# Patient Record
Sex: Female | Born: 1950 | Race: White | Hispanic: No | Marital: Single | Smoking: Never smoker
Health system: Southern US, Community
[De-identification: ages and names within clinical notes are randomized; demographics above are authoritative.]

## PROBLEM LIST (undated history)

## (undated) DIAGNOSIS — F329 Major depressive disorder, single episode, unspecified: Secondary | ICD-10-CM

## (undated) DIAGNOSIS — F32A Depression, unspecified: Secondary | ICD-10-CM

## (undated) DIAGNOSIS — E785 Hyperlipidemia, unspecified: Secondary | ICD-10-CM

## (undated) DIAGNOSIS — M25569 Pain in unspecified knee: Secondary | ICD-10-CM

## (undated) DIAGNOSIS — G8929 Other chronic pain: Secondary | ICD-10-CM

## (undated) DIAGNOSIS — I10 Essential (primary) hypertension: Secondary | ICD-10-CM

## (undated) HISTORY — PX: OTHER SURGICAL HISTORY: SHX169

## (undated) HISTORY — PX: ABDOMINAL HYSTERECTOMY: SHX81

## (undated) HISTORY — PX: KNEE SURGERY: SHX244

---

## 2011-09-12 ENCOUNTER — Emergency Department (HOSPITAL_BASED_OUTPATIENT_CLINIC_OR_DEPARTMENT_OTHER)
Admission: EM | Admit: 2011-09-12 | Discharge: 2011-09-12 | Disposition: A | Discharge status: Home Health | Payer: Worker's Compensation | Attending: Emergency Medicine | Admitting: Emergency Medicine

## 2011-09-12 ENCOUNTER — Encounter (HOSPITAL_BASED_OUTPATIENT_CLINIC_OR_DEPARTMENT_OTHER): Payer: Self-pay | Admitting: *Deleted

## 2011-09-12 ENCOUNTER — Emergency Department (INDEPENDENT_AMBULATORY_CARE_PROVIDER_SITE_OTHER): Payer: Worker's Compensation

## 2011-09-12 DIAGNOSIS — S42309A Unspecified fracture of shaft of humerus, unspecified arm, initial encounter for closed fracture: Secondary | ICD-10-CM

## 2011-09-12 DIAGNOSIS — S42253A Displaced fracture of greater tuberosity of unspecified humerus, initial encounter for closed fracture: Secondary | ICD-10-CM | POA: Insufficient documentation

## 2011-09-12 DIAGNOSIS — E7800 Pure hypercholesterolemia, unspecified: Secondary | ICD-10-CM | POA: Insufficient documentation

## 2011-09-12 DIAGNOSIS — W19XXXA Unspecified fall, initial encounter: Secondary | ICD-10-CM

## 2011-09-12 DIAGNOSIS — Z79899 Other long term (current) drug therapy: Secondary | ICD-10-CM | POA: Insufficient documentation

## 2011-09-12 DIAGNOSIS — W010XXA Fall on same level from slipping, tripping and stumbling without subsequent striking against object, initial encounter: Secondary | ICD-10-CM | POA: Insufficient documentation

## 2011-09-12 DIAGNOSIS — I10 Essential (primary) hypertension: Secondary | ICD-10-CM | POA: Insufficient documentation

## 2011-09-12 HISTORY — DX: Essential (primary) hypertension: I10

## 2011-09-12 HISTORY — DX: Major depressive disorder, single episode, unspecified: F32.9

## 2011-09-12 HISTORY — DX: Pain in unspecified knee: M25.569

## 2011-09-12 HISTORY — DX: Hyperlipidemia, unspecified: E78.5

## 2011-09-12 HISTORY — DX: Depression, unspecified: F32.A

## 2011-09-12 HISTORY — DX: Other chronic pain: G89.29

## 2011-09-12 MED ORDER — OXYCODONE-ACETAMINOPHEN 5-325 MG PO TABS: 2.0000 | ORAL_TABLET | ORAL | Status: AC | PRN

## 2011-09-12 MED ORDER — ONDANSETRON 4 MG PO TBDP
ORAL_TABLET | ORAL | Status: AC
  Administered 2011-09-12: 4 mg
  Filled 2011-09-12: qty 1

## 2011-09-12 MED ORDER — HYDROMORPHONE HCL PF 1 MG/ML IJ SOLN
1.0000 mg | Freq: Once | INTRAMUSCULAR | Status: AC
  Administered 2011-09-12: 1 mg via INTRAVENOUS
  Filled 2011-09-12: qty 1

## 2011-09-12 NOTE — ED Provider Notes (Signed)
Medical screening examination/treatment/procedure(s) were performed by non-physician practitioner and as supervising physician I was immediately available for consultation/collaboration.   Fernand Sorbello A. Dondrell Loudermilk, MD 09/12/11 2348 

## 2011-09-12 NOTE — Discharge Instructions (Signed)
Upper Extremity Fracture Broken bones take weeks to heal and require protection and proper follow-up. The broken ends must be lined up correctly and kept perfectly still for proper healing. Do not remove the splint, immobilizer, or cast that has been applied to treat your injury until instructed to do so by your caregiver. This is the most important part of your treatment. Other measures to treat fractures include:  Keeping the injured limb at rest and elevated as recommended by your caregiver. This will help reduce pain and swelling. Use pillows to rest and elevate your arm on at night.   Applying ice packs to your fracture site frequently for the next 2 to 3 days.   Pain medicine is often prescribed in the first days after a fracture. Only take over-the-counter or prescription medicines for pain, discomfort, or fever as directed by your caregiver.  Proper follow-up care is very important, so call your caregiver for an appointment as soon as possible. Follow-up X-rays are generally recommended to monitor healing. SEEK IMMEDIATE MEDICAL CARE IF:  You notice increasing pain or pressure in the injured arm or hand, or if it your extremity becomes cold, numb, or pale.  MAKE SURE YOU:   Understand these instructions.   Will watch your condition.   Will get help right away if you are not doing well or get worse.  Document Released: 08/18/2004 Document Revised: 03/23/2011 Document Reviewed: 08/13/2008 ExitCare Patient Information 2012 [employer]. 

## 2011-09-12 NOTE — ED Notes (Signed)
Vrinda Pickering, FNP at bedside 

## 2011-09-12 NOTE — ED Notes (Signed)
EMS reports patient was at work and slipped on a clothes hanger and fell.  Caught herself with her right arm.  Now has pain right upper arm.

## 2011-09-12 NOTE — ED Notes (Signed)
Pt returned from radiology.

## 2011-09-12 NOTE — ED Notes (Signed)
#  20 g angiocath inserted in left ac by [facility].  Fentanyl given for pain.  VSS, NSR.

## 2011-09-12 NOTE — ED Provider Notes (Signed)
History     CSN: [id number]  Arrival date & time 09/12/11  9562   First MD Initiated Contact with Patient 09/12/11 1832      Chief Complaint  Patient presents with  . Fall    right upper arm pain    (Consider location/radiation/quality/duration/timing/severity/associated sxs/prior treatment) HPI Comments: Pt was at worked and slipped on a hanger and fell:pt states that she is having pain in the right upper arm and is unable to fully lift her arm  Patient is a 61 y.o. female presenting with fall. The history is provided by the patient. No language interpreter was used.  Fall The accident occurred 1 to 2 hours ago. The fall occurred while standing. She landed on a hard floor. Point of impact: right arm. Pain location: right arm. The pain is moderate. She was ambulatory at the scene. There was no entrapment after the fall. There was no drug use involved in the accident. There was no alcohol use involved in the accident. The symptoms are aggravated by activity.    Past Medical History  Diagnosis Date  . Hypertension   . Chronic knee pain     keft  . Hyperlipemia   . Depression     Past Surgical History  Procedure Date  . Abdominal hysterectomy   . Knee surgery     No family history on file.  History  Substance Use Topics  . Smoking status: Never Smoker   . Smokeless tobacco: Never Used  . Alcohol Use: No    OB History    Grav Para Term Preterm Abortions TAB SAB Ect Mult Living                  Review of Systems  All other systems reviewed and are negative.    Allergies  Review of patient's allergies indicates no known allergies.  Home Medications   Current Outpatient Rx  Name Route Sig Dispense Refill  . ATENOLOL 25 MG PO TABS Oral Take 25 mg by mouth daily.    . BUPROPION HCL 100 MG PO TABS Oral Take 100 mg by mouth 2 (two) times daily.     Marland Kitchen CALCIUM PO Oral Take 1 tablet by mouth daily.    Marland Kitchen ESTRADIOL 0.5 MG PO TABS Oral Take by mouth daily.    .  IBUPROFEN 200 MG PO TABS Oral Take 800 mg by mouth 2 (two) times daily.    . OMEGA-3-ACID ETHYL ESTERS 1 G PO CAPS Oral Take 2 g by mouth 2 (two) times daily.    Marland Kitchen SIMVASTATIN 20 MG PO TABS Oral Take 20 mg by mouth every evening.      Ht 5\' 6"  (1.676 m)  Wt 205 lb (92.987 kg)  BMI 33.09 kg/m2  Physical Exam  Nursing note and vitals reviewed. Constitutional: She appears well-developed and well-nourished.  HENT:  Head: Normocephalic.  Eyes: EOM are normal.  Neck: Neck supple.  Cardiovascular: Normal rate and regular rhythm.   Pulmonary/Chest: Effort normal and breath sounds normal.  Abdominal: Soft. Bowel sounds are normal.  Musculoskeletal:       Cervical back: Normal.       Thoracic back: Normal.       Lumbar back: Normal.       Right upper arm: She exhibits tenderness. She exhibits no deformity.  Skin: Skin is warm and dry.  Psychiatric: She has a normal mood and affect.    ED Course  Procedures (including critical care time)  Labs Reviewed -  No data to display Dg Shoulder Right  09/12/2011  *RADIOLOGY REPORT*  Clinical Data: Larey Seat with right shoulder pain  RIGHT SHOULDER - 2+ VIEW  Comparison: None.  Findings: There is fracture of the right humeral greater tuberosity which also appears to involve the right humeral neck.  This fracture is nondisplaced.  The humeral head is in normal position within the glenoid fossa.  The right [facility] joint is normally aligned.  IMPRESSION: Fracture of the right greater tuberosity at the right humeral neck without significant displacement.  Original Report Authenticated By: Juline Patch, M.D.   Dg Humerus Right  09/12/2011  *RADIOLOGY REPORT*  Clinical Data: Larey Seat at work injuring the right upper arm  RIGHT HUMERUS - 2+ VIEW  Comparison: None.  Findings: There is fracture involving the right humeral greater tuberosity and possibly a nondisplaced fracture of the humeral neck.  CT may be helpful to assess more sensitively.  The remainder of the humerus  is intact.  IMPRESSION: Fracture of the right humeral greater tuberosity and possibly the right humeral neck.  Consider CT to assess further.  Original Report Authenticated By: Juline Patch, M.D.     1. Humerus fracture       MDM  Pt placed in a sling and to follow up with ortho:pt treated with pain medication:       Teressa Lower, NP 09/12/11 1944

## 2012-04-27 DIAGNOSIS — C50919 Malignant neoplasm of unspecified site of unspecified female breast: Secondary | ICD-10-CM | POA: Insufficient documentation

## 2013-03-04 DIAGNOSIS — I890 Lymphedema, not elsewhere classified: Secondary | ICD-10-CM | POA: Insufficient documentation

## 2013-03-06 DIAGNOSIS — E559 Vitamin D deficiency, unspecified: Secondary | ICD-10-CM | POA: Insufficient documentation

## 2013-03-13 DIAGNOSIS — M810 Age-related osteoporosis without current pathological fracture: Secondary | ICD-10-CM | POA: Insufficient documentation

## 2013-07-08 DIAGNOSIS — I10 Essential (primary) hypertension: Secondary | ICD-10-CM | POA: Insufficient documentation

## 2013-07-08 DIAGNOSIS — H409 Unspecified glaucoma: Secondary | ICD-10-CM | POA: Insufficient documentation

## 2014-03-24 DIAGNOSIS — H25819 Combined forms of age-related cataract, unspecified eye: Secondary | ICD-10-CM | POA: Insufficient documentation

## 2014-04-29 DIAGNOSIS — Z9849 Cataract extraction status, unspecified eye: Secondary | ICD-10-CM | POA: Insufficient documentation

## 2014-07-29 DIAGNOSIS — A419 Sepsis, unspecified organism: Secondary | ICD-10-CM | POA: Insufficient documentation

## 2014-07-29 DIAGNOSIS — L02223 Furuncle of chest wall: Secondary | ICD-10-CM | POA: Insufficient documentation

## 2014-07-29 DIAGNOSIS — E782 Mixed hyperlipidemia: Secondary | ICD-10-CM | POA: Insufficient documentation

## 2014-07-29 DIAGNOSIS — E270 Other adrenocortical overactivity: Secondary | ICD-10-CM | POA: Insufficient documentation

## 2015-05-08 DIAGNOSIS — I890 Lymphedema, not elsewhere classified: Secondary | ICD-10-CM | POA: Diagnosis not present

## 2015-05-08 DIAGNOSIS — G629 Polyneuropathy, unspecified: Secondary | ICD-10-CM | POA: Diagnosis not present

## 2015-05-08 DIAGNOSIS — C50112 Malignant neoplasm of central portion of left female breast: Secondary | ICD-10-CM | POA: Diagnosis not present

## 2015-05-08 DIAGNOSIS — C50512 Malignant neoplasm of lower-outer quadrant of left female breast: Secondary | ICD-10-CM | POA: Diagnosis not present

## 2015-05-08 DIAGNOSIS — T451X5A Adverse effect of antineoplastic and immunosuppressive drugs, initial encounter: Secondary | ICD-10-CM | POA: Diagnosis not present

## 2015-05-08 DIAGNOSIS — F4321 Adjustment disorder with depressed mood: Secondary | ICD-10-CM | POA: Diagnosis not present

## 2015-05-08 DIAGNOSIS — G6289 Other specified polyneuropathies: Secondary | ICD-10-CM

## 2015-05-08 DIAGNOSIS — G622 Polyneuropathy due to other toxic agents: Secondary | ICD-10-CM | POA: Insufficient documentation

## 2015-05-08 DIAGNOSIS — G620 Drug-induced polyneuropathy: Secondary | ICD-10-CM | POA: Diagnosis not present

## 2015-05-08 DIAGNOSIS — Z171 Estrogen receptor negative status [ER-]: Secondary | ICD-10-CM | POA: Diagnosis not present

## 2015-06-16 DIAGNOSIS — K222 Esophageal obstruction: Secondary | ICD-10-CM | POA: Diagnosis not present

## 2015-06-16 DIAGNOSIS — K219 Gastro-esophageal reflux disease without esophagitis: Secondary | ICD-10-CM | POA: Diagnosis not present

## 2015-07-22 DIAGNOSIS — R3915 Urgency of urination: Secondary | ICD-10-CM | POA: Diagnosis not present

## 2015-07-22 DIAGNOSIS — R232 Flushing: Secondary | ICD-10-CM | POA: Diagnosis not present

## 2015-07-22 DIAGNOSIS — G4709 Other insomnia: Secondary | ICD-10-CM | POA: Diagnosis not present

## 2015-07-22 DIAGNOSIS — F419 Anxiety disorder, unspecified: Secondary | ICD-10-CM | POA: Diagnosis not present

## 2015-07-22 DIAGNOSIS — R002 Palpitations: Secondary | ICD-10-CM | POA: Diagnosis not present

## 2015-07-22 DIAGNOSIS — F329 Major depressive disorder, single episode, unspecified: Secondary | ICD-10-CM | POA: Diagnosis not present

## 2015-07-22 DIAGNOSIS — I10 Essential (primary) hypertension: Secondary | ICD-10-CM | POA: Diagnosis not present

## 2015-08-24 ENCOUNTER — Ambulatory Visit (INDEPENDENT_AMBULATORY_CARE_PROVIDER_SITE_OTHER): Payer: 59 | Admitting: Licensed Clinical Social Worker

## 2015-08-24 DIAGNOSIS — Z634 Disappearance and death of family member: Principal | ICD-10-CM

## 2015-08-24 DIAGNOSIS — F4329 Adjustment disorder with other symptoms: Secondary | ICD-10-CM

## 2015-08-24 DIAGNOSIS — F4321 Adjustment disorder with depressed mood: Secondary | ICD-10-CM

## 2015-08-25 ENCOUNTER — Encounter (HOSPITAL_COMMUNITY): Payer: Self-pay | Admitting: Licensed Clinical Social Worker

## 2015-08-25 DIAGNOSIS — F4321 Adjustment disorder with depressed mood: Secondary | ICD-10-CM | POA: Insufficient documentation

## 2015-08-25 DIAGNOSIS — Z634 Disappearance and death of family member: Principal | ICD-10-CM

## 2015-08-25 DIAGNOSIS — F4329 Adjustment disorder with other symptoms: Secondary | ICD-10-CM | POA: Insufficient documentation

## 2015-08-25 NOTE — Progress Notes (Signed)
Comprehensive Clinical Assessment (CCA) Note  08/25/2015 Gabriela Jensen [id number]  Visit Diagnosis:      ICD-9-CM ICD-10-CM   1. 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  (See scanned document in Chart Review)  CCA Part Two A  Intake/Chief Complaint:  CCA Intake With Chief Complaint CCA Part Two Date: 08/24/15 CCA Part Two Time: 1004 Chief Complaint/Presenting Problem: "I can't get past the death of my friend."  Had a close relationship with Rush Landmark for about 12 years until his death in 05/17/13.   They spoke or saw each other daily.  Notes that she lost her job shortly before his death.  Did not get unemployment.   Patients Currently Reported Symptoms/Problems: Excessive crying spells.  Being overly emotional prevents her from getting tasks done.  Reminded of her friend whereever she goes so she avoids going out.  Has had some significant health problems in the past two years.  Reports "Breast cancer was a piece of cake."  Had implants placed.  Having trouble adjusting to changes in the look and feel of her body.  In January 2016 she contracted MRSA.  Needed home health care for 4 months.  Couldn't do much.  Gained a lot of weight.   Individual's Strengths: "I think I'm nice.  People tell me that.  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   Individual's Preferences: "I need tools to learn how to live without Bill."   Type of Services Patient Feels Are Needed: Therapy and med management  Has appointment to see our psychiatrist within the week.  Reports she has been on antidepressants since 2006.  Mental Health Symptoms Depression:  Depression: Tearfulness, Weight gain/loss  Mania:     Anxiety:   Anxiety: Worrying, Irritability  Psychosis:  Psychosis: N/A  Trauma:  Trauma: N/A  Obsessions:  Obsessions: N/A  Compulsions:  Compulsions: N/A  Inattention:  Inattention: N/A   Hyperactivity/Impulsivity:  Hyperactivity/Impulsivity: N/A  Oppositional/Defiant Behaviors:  Oppositional/Defiant Behaviors: N/A  Borderline Personality:     Other Mood/Personality Symptoms:      Mental Status Exam Appearance and self-care  Stature:  Stature: Average  Weight:  Weight: Obese  Clothing:  Clothing: Casual  Grooming:  Grooming: Normal  Cosmetic use:  Cosmetic Use: None  Posture/gait:  Posture/Gait: Normal  Motor activity:  Motor Activity: Not Remarkable  Sensorium  Attention:  Attention: Distractible  Concentration:  Concentration: Scattered  Orientation:  Orientation: X5  Recall/memory:     Affect and Mood  Affect:  Affect: Labile, Tearful  Mood:  Mood: Anxious  Relating  Eye contact:  Eye Contact: Normal  Facial expression:  Facial Expression: Sad, Anxious  Attitude toward examiner:  Attitude Toward Examiner: Dramatic  Thought and Language  Speech flow: Speech Flow: Pressured  Thought content:  Thought Content: Appropriate to mood and circumstances  Preoccupation:     Hallucinations:     Organization:     Transport planner of Knowledge:     Intelligence:     Abstraction:     Judgement:     Reality Testing:     Insight:     Decision Making:     Social Functioning  Social Maturity:  Social Maturity: Isolates (Reports she is starting to connect with friends again.)  Social Judgement:     Stress  Stressors:  Stressors: Grief/losses  Coping Ability:  Coping Ability:  Overwhelmed, Deficient supports  Skill Deficits:     Supports:      Family and Psychosocial History: Family history Marital status: Divorced Divorced, when?: Married to Grimes for 21 years.  Divorced in 1991.  He lives in [place].  "He didn't like that I was fat.  He had an affair."  He is moving into the area.   Does patient have children?: Yes How many children?: 2 How is patient's relationship with their children?: Daughter, Andee Poles (42)  Lives in [place].  Daughter,  Sharyn Lull lives in [place].  Reports both daughters have expressed concern about her mental health saying she is "hard to deal with."  Childhood History:  Childhood History Additional childhood history information: Born and raised in [place].   Does patient have siblings?: Yes Number of Siblings: 1 Description of patient's current relationship with siblings: Brother lives in [place].   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  Not able to ride anymore due to problems with balance and neuropathy.  Enjoys watching her grandchildren play sports.  Sometimes stays in bed watching TV.  Exercise/Diet: Exercise/Diet Do You Exercise?: No (Reports she plans to start because she joined the [employer] program.) Do You Follow a Special Diet?: No Do You Have Any Trouble Sleeping?: No  CCA Part Two C  Alcohol/Drug Use: Alcohol / Drug Use History of alcohol / drug use?: No history of alcohol / drug abuse                      CCA Part Three  ASAM's:  Six Dimensions of Multidimensional Assessment  Dimension 1:  Acute Intoxication and/or Withdrawal Potential:     Dimension 2:   Biomedical Conditions and Complications:     Dimension 3:  Emotional, Behavioral, or Cognitive Conditions and Complications:     Dimension 4:  Readiness to Change:     Dimension 5:  Relapse, Continued use, or Continued Problem Potential:     Dimension 6:  Recovery/Living Environment:      Substance use Disorder (SUD)    Social Function:  Social Functioning Social Maturity: Isolates (Reports she is starting to connect with friends again.)  Stress:  Stress Stressors: Grief/losses Coping Ability: Overwhelmed, Deficient supports Patient Takes Medications The Way The Doctor Instructed?: Yes  Risk Assessment- Self-Harm Potential: Risk Assessment For Self-Harm Potential Thoughts of Self-Harm: No current thoughts Additional Comments for Self-Harm Potential: Denies any history of SI or self harm  Risk Assessment -Dangerous to Others Potential: Risk Assessment For Dangerous to Others Potential Method: No Plan Additional Comments for Danger to Others Potential: Denies any history of harm to others  DSM5 Diagnoses: Patient Active Problem List   Diagnosis Date Noted  .  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 Systemic infection ([place]) 07/29/2014  . Combined fat and carbohydrate induced hyperlipemia 07/29/2014  . Furuncle of chest wall 07/29/2014  . H/O cataract extraction 04/29/2014  . 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 Essential (primary) hypertension 07/08/2013  . Glaucoma 07/08/2013  . OP (osteoporosis) 03/13/2013  . Avitaminosis D 03/06/2013  . Lymphedema of arm 03/04/2013  . Malignant neoplasm of breast ([place]) 04/27/2012      Recommendations for Services/Supports/Treatments: Recommendations for Services/Supports/Treatments Recommendations For Services/Supports/Treatments: Individual Therapy, Medication Management   Garnette Scheuermann

## 2015-08-27 ENCOUNTER — Ambulatory Visit (INDEPENDENT_AMBULATORY_CARE_PROVIDER_SITE_OTHER): Payer: 59 | Admitting: Psychiatry

## 2015-08-27 ENCOUNTER — Encounter (HOSPITAL_COMMUNITY): Payer: Self-pay | Admitting: Psychiatry

## 2015-08-27 VITALS — BP 130/72 | HR 62 | Ht 65.5 in | Wt 235.0 lb

## 2015-08-27 DIAGNOSIS — F4321 Adjustment disorder with depressed mood: Secondary | ICD-10-CM

## 2015-08-27 DIAGNOSIS — F331 Major depressive disorder, recurrent, moderate: Secondary | ICD-10-CM

## 2015-08-27 DIAGNOSIS — Z634 Disappearance and death of family member: Principal | ICD-10-CM

## 2015-08-27 DIAGNOSIS — G4700 Insomnia, unspecified: Secondary | ICD-10-CM | POA: Diagnosis not present

## 2015-08-27 DIAGNOSIS — F4329 Adjustment disorder with other symptoms: Secondary | ICD-10-CM

## 2015-08-27 MED ORDER — ESCITALOPRAM OXALATE 10 MG PO TABS: 10.0000 mg | ORAL_TABLET | Freq: Every day | ORAL | Status: DC

## 2015-08-27 MED ORDER — BUPROPION HCL 100 MG PO TABS: 100.0000 mg | ORAL_TABLET | Freq: Two times a day (BID) | ORAL | Status: DC

## 2015-08-27 NOTE — Progress Notes (Signed)
Psychiatric Initial Adult Assessment   Patient Identification: Gabriela Jensen MRN:  [id number] Date of Evaluation:  08/27/2015 Referral Source: Primary care Chief Complaint:   Chief Complaint    Establish Care     Visit Diagnosis:    ICD-9-CM ICD-10-CM   1. 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 Insomnia 780.52 G47.00   3. Moderate episode of recurrent major depressive disorder (HCC) 296.32 F33.1    Diagnosis:   Patient Active Problem List   Diagnosis Date Noted  . Complicated bereavement 0000000 08/25/2015  . 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 Systemic infection ([place]) [A41.9] 07/29/2014  . 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 Furuncle of chest wall [L02.223] 07/29/2014  . H/O cataract extraction [Z98.49] 04/29/2014  . 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 Essential (primary) hypertension [I10] 07/08/2013  . Glaucoma [H40.9] 07/08/2013  . OP (osteoporosis) [M81.0] 03/13/2013  . Avitaminosis D [E55.9] 03/06/2013  . Lymphedema of arm [I89.0] 03/04/2013  . Malignant neoplasm of breast [facility]) [C50.919] 04/27/2012   History of Present Illness:  65 years old currently single Caucasian female referred by primary care physician for management of grief and depression. She is also seeing her counselor.  Patient is lost a close friend named Bell in 11/03/12 since then she has been suffering from grief. Gets emotional when she talks about him. She always thinks about him. She is getting depressed a motivated. She is going through the grief but it is not calm much better. She is having crying spells. Her sleep is improved since she is on gabapentin which she takes for neuropathy. She also endorses excessive worries, unreasonable at times. 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 Breast  cancer in 11/04/11. Neuropathy related to chemotherapy Modifying factors; her friends she tries to go to the gym Past aggravating factors; divorced in Nov 04, 1990 after that she has been in medications for depression. Panic attack in 2001/11/03  Location;  Friend's death, depression, anxiety, insomnia Severity of depression: 4/10. 10 being no depression Context: friends death, anniversaries and birthdays Duration: variable since Nov 04, 1990  Associated Signs/Symptoms: Depression Symptoms:  depressed mood, anhedonia, insomnia, difficulty concentrating, anxiety, (Hypo) Manic Symptoms:  Distractibility, Anxiety Symptoms:  Excessive Worry, Psychotic Symptoms:  denies PTSD Symptoms: Had a traumatic exposure:  friends death grief Past Psychiatric History: Outpatient treatment for depression in 11-04-90 after divorced. Her husband left her because she was having fluctuations in her. He left her for another friend of hers. Panic attack in 11/03/2001 Past medication use has been Cymbalta that caused sweating.  Lexapro did not help her depression and it did help her anxiety somewhat Past Medical History:  Past Medical History  Diagnosis Date  . Hypertension   . Chronic knee pain     keft  . Hyperlipemia   . Depression     Past Surgical History  Procedure Laterality Date  . Abdominal hysterectomy    . Knee surgery    . Masectomy and reconstruction  11-03-12 and October 2014   Family History:  Family History  Problem Relation Age of Onset  . Alcohol abuse Father    Social History:   Social History   Social History  . Marital Status: Single    Spouse Name: N/A  . Number of Children: N/A  . Years of Education: N/A   Social History Main Topics  .  Smoking status: Never Smoker   . Smokeless tobacco: Never Used  . Alcohol Use: No  . Drug Use: No  . Sexual Activity: Not Currently   Other Topics Concern  . None   Social History Narrative   Additional Social History: She grew up with her parents.  Her dad was an alcoholic he would be harsh towards her brother and her mom. 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 She has grown kids she keeps in touch with her daughter who is bipolar. Currently she is retired herself. She has lost a good friend in 2014 since then she is going through grief process  Musculoskeletal: Strength & Muscle Tone: within normal limits Gait & Station: normal Patient leans: N/A  Psychiatric Specialty Exam: HPI  Review of Systems  Constitutional: Negative for fever.  Musculoskeletal: Negative for neck pain.  Skin: Negative for rash.  Neurological: Negative for tremors.  Psychiatric/Behavioral: Positive for depression. The patient is nervous/anxious and has insomnia.     Blood pressure 130/72, pulse 62, height 5' 5.5" (1.664 m), weight 235 lb (106.595 kg), SpO2 96 %.Body mass index is 38.5 kg/(m^2).  General Appearance: Casual  Eye Contact:  Fair  Speech:  Normal Rate  Volume:  Normal  Mood:  Dysphoric  Affect:  Tearful  Thought Process:  Coherent  Orientation:  Full (Time, Place, and Person)  Thought Content:  Rumination  Suicidal Thoughts:  No  Homicidal Thoughts:  No  Memory:  Immediate;   Fair Recent;   Fair  Judgement:  Fair  Insight:  Fair  Psychomotor Activity:  Normal  Concentration:  Fair  Recall:  [employer] of Knowledge:Fair  Language: Fair  Akathisia:  Negative  Handed:  Right  AIMS (if indicated):    Assets:  Desire for Improvement  ADL's:  Intact  Cognition: WNL  Sleep:  variable   Is the patient at risk to self?  No. Has the patient been a risk to self in the past 6 months?  No. Has the patient been a risk to self within the distant past?  No. Is the patient a risk to others?  No. Has the patient been a risk to others in the past 6 months?  No. Has the patient been a risk to others within the distant  past?  No.  Allergies:  No Known Allergies Current Medications: Current Outpatient Prescriptions  Medication Sig Dispense Refill  . atenolol (TENORMIN) 25 MG tablet Take 25 mg by mouth daily.    Marland Kitchen buPROPion (WELLBUTRIN) 100 MG tablet Take 1 tablet (100 mg total) by mouth 2 (two) times daily. Reported on 08/24/2015 60 tablet 0  . CALCIUM PO Take 1 tablet by mouth daily.    Marland Kitchen estradiol (ESTRACE) 0.5 MG tablet Take by mouth daily.    Marland Kitchen gabapentin (NEURONTIN) 100 MG capsule Take 500 mg by mouth daily.   0  . ibuprofen (ADVIL,MOTRIN) 200 MG tablet Take 800 mg by mouth 2 (two) times daily.    Marland Kitchen omega-3 acid ethyl esters (LOVAZA) 1 G capsule Take 2 g by mouth 2 (two) times daily.    . simvastatin (ZOCOR) 20 MG tablet Take 20 mg by mouth every evening.    . escitalopram (LEXAPRO) 10 MG tablet Take 1 tablet (10 mg total) by mouth daily. Start half tablet for first 5 days and then start one a  day 30 tablet 0   No current facility-administered medications for this visit.    Previous Psychotropic Medications: Yes  Cymbalta; sweating   Substance Abuse History in the last 12 months:  No.  Consequences of Substance Abuse: NA  Medical Decision Making:  Review of Psycho-Social Stressors (1), Discuss test with performing physician (1), Review of Medication Regimen & Side Effects (2) and Review of New Medication or Change in Dosage (2)  Treatment Plan Summary: Medication management and Plan as follows  Grief/complicated: Add Lexapro 5 mg increased to 10 mg next 7-10 days. This will be in addition to the Wellbutrin she is taking. At higher doses of Wellbutrin she does not respond Insomnia: taking neurontin for neuropathy but that is helping her sleep reviewed sleep hygiene She is a nonsmoker. Patient and non-user of alcohol or drugs Major depressive disorder. Her grief very complicated that she is suffering from a recurrent major depression. Continue Wellbutrin and Lexapro as above More than 50% time  spent in counseling and coordination of care and patient education side effects Recommend she continue therapy for her complicated grief Follow-up in 2-4 weeks or earlier if there is call 911 or report to the emergency room for any urgent concerns of suicidal thoughts    Yarelie Hams 2/2/201711:10 AM

## 2015-09-01 DIAGNOSIS — L300 Nummular dermatitis: Secondary | ICD-10-CM | POA: Diagnosis not present

## 2015-09-01 DIAGNOSIS — L820 Inflamed seborrheic keratosis: Secondary | ICD-10-CM | POA: Diagnosis not present

## 2015-09-10 ENCOUNTER — Ambulatory Visit (INDEPENDENT_AMBULATORY_CARE_PROVIDER_SITE_OTHER): Payer: 59 | Admitting: Licensed Clinical Social Worker

## 2015-09-10 DIAGNOSIS — F331 Major depressive disorder, recurrent, moderate: Secondary | ICD-10-CM

## 2015-09-10 DIAGNOSIS — F4321 Adjustment disorder with depressed mood: Secondary | ICD-10-CM | POA: Diagnosis not present

## 2015-09-10 DIAGNOSIS — F4329 Adjustment disorder with other symptoms: Secondary | ICD-10-CM

## 2015-09-10 DIAGNOSIS — Z634 Disappearance and death of family member: Principal | ICD-10-CM

## 2015-09-10 NOTE — Progress Notes (Signed)
   THERAPIST PROGRESS NOTE  Session Time: 10:05-11:00am  Participation Level: Active  Behavioral Response: CasualAlert A little tearful  Type of Therapy: Individual Therapy  Treatment Goals addressed: Developed treatment plan today  Interventions: Treatment planning and mindfulness    Suicidal/Homicidal: Denied both  Therapist Interventions: Collaborated with patient to develop her treatment plan.  Introduced the concept of mindfulness.  Emphasized how learning to focus on the present can help you to feel more in control of your emotions.  Explained how it can be useful to practice at times when you catch yourself having unhelpful thoughts.   Provided patient with a few pages to read about mindfulness.    Summary:   Indicated she would like to feel better about how she is coping with thoughts and feelings of grief.  Admitted that she will often tell herself not to think about the friend she lost.  Acknowledged that this strategy doesn't work.  Reported others have encouraged her not to look at pictures of him.  Relieved to hear therapist talk about how "it's OK to have pictures of him and to think about him" because it is better to expose herself to stimuli that reminds her of him and allow herself to feel her feelings than to always engage in avoidance.   Indicated she understood concepts presented today and that she is open to using mindfulness.  Said "I'm anxious to read all that because I think it is a tool that I can use."  Started Lexapro.  Has experienced a reduction in frequency of tears.  Indicated she is happy about this.      Plan: Return again in approximately 2 weeks.  Will expand on mindfulness.  Diagnosis: Complicated Grief    Armandina Stammer 09/10/2015

## 2015-09-18 DIAGNOSIS — Z853 Personal history of malignant neoplasm of breast: Secondary | ICD-10-CM | POA: Diagnosis not present

## 2015-09-18 DIAGNOSIS — G620 Drug-induced polyneuropathy: Secondary | ICD-10-CM | POA: Diagnosis not present

## 2015-09-18 DIAGNOSIS — Z171 Estrogen receptor negative status [ER-]: Secondary | ICD-10-CM | POA: Diagnosis not present

## 2015-09-18 DIAGNOSIS — Z86000 Personal history of in-situ neoplasm of breast: Secondary | ICD-10-CM | POA: Diagnosis not present

## 2015-09-18 DIAGNOSIS — Z08 Encounter for follow-up examination after completed treatment for malignant neoplasm: Secondary | ICD-10-CM | POA: Diagnosis not present

## 2015-09-18 DIAGNOSIS — T451X5A Adverse effect of antineoplastic and immunosuppressive drugs, initial encounter: Secondary | ICD-10-CM | POA: Diagnosis not present

## 2015-09-18 DIAGNOSIS — I890 Lymphedema, not elsewhere classified: Secondary | ICD-10-CM | POA: Diagnosis not present

## 2015-09-18 DIAGNOSIS — T451X5S Adverse effect of antineoplastic and immunosuppressive drugs, sequela: Secondary | ICD-10-CM | POA: Diagnosis not present

## 2015-09-18 DIAGNOSIS — C50412 Malignant neoplasm of upper-outer quadrant of left female breast: Secondary | ICD-10-CM | POA: Diagnosis not present

## 2015-09-23 ENCOUNTER — Other Ambulatory Visit (HOSPITAL_COMMUNITY): Payer: Self-pay | Admitting: Psychiatry

## 2015-09-24 ENCOUNTER — Ambulatory Visit (HOSPITAL_COMMUNITY): Payer: Self-pay | Admitting: Licensed Clinical Social Worker

## 2015-09-25 ENCOUNTER — Ambulatory Visit (HOSPITAL_COMMUNITY): Payer: Self-pay | Admitting: Psychiatry

## 2015-09-29 NOTE — Telephone Encounter (Signed)
Received medication request from [facility] for Lexapro 10mg . Per Dr. De Nurse, pt is authorized for Lexapro 10mg , #30. Prescription was sent to pharmacy. Pt is schedule for a f/u appt on 10/01/15. Called and informed pt of prescription status. Pt verbalizes understanding.

## 2015-09-30 DIAGNOSIS — I10 Essential (primary) hypertension: Secondary | ICD-10-CM | POA: Diagnosis not present

## 2015-09-30 DIAGNOSIS — M25561 Pain in right knee: Secondary | ICD-10-CM | POA: Diagnosis not present

## 2015-09-30 DIAGNOSIS — M1711 Unilateral primary osteoarthritis, right knee: Secondary | ICD-10-CM | POA: Diagnosis not present

## 2015-09-30 DIAGNOSIS — Z01818 Encounter for other preprocedural examination: Secondary | ICD-10-CM | POA: Diagnosis not present

## 2015-09-30 DIAGNOSIS — E785 Hyperlipidemia, unspecified: Secondary | ICD-10-CM | POA: Diagnosis not present

## 2015-09-30 DIAGNOSIS — Z79899 Other long term (current) drug therapy: Secondary | ICD-10-CM | POA: Diagnosis not present

## 2015-10-01 ENCOUNTER — Ambulatory Visit (INDEPENDENT_AMBULATORY_CARE_PROVIDER_SITE_OTHER): Payer: 59 | Admitting: Psychiatry

## 2015-10-01 ENCOUNTER — Encounter (HOSPITAL_COMMUNITY): Payer: Self-pay | Admitting: Psychiatry

## 2015-10-01 VITALS — BP 124/62 | HR 73 | Ht 65.5 in | Wt 228.0 lb

## 2015-10-01 DIAGNOSIS — F331 Major depressive disorder, recurrent, moderate: Secondary | ICD-10-CM | POA: Diagnosis not present

## 2015-10-01 DIAGNOSIS — F4321 Adjustment disorder with depressed mood: Secondary | ICD-10-CM

## 2015-10-01 DIAGNOSIS — G4700 Insomnia, unspecified: Secondary | ICD-10-CM | POA: Diagnosis not present

## 2015-10-01 DIAGNOSIS — Z634 Disappearance and death of family member: Secondary | ICD-10-CM

## 2015-10-01 DIAGNOSIS — F4381 Prolonged grief disorder: Secondary | ICD-10-CM

## 2015-10-01 DIAGNOSIS — F4329 Adjustment disorder with other symptoms: Secondary | ICD-10-CM

## 2015-10-01 MED ORDER — BUPROPION HCL 100 MG PO TABS: 100.0000 mg | ORAL_TABLET | Freq: Two times a day (BID) | ORAL | Status: AC

## 2015-10-01 MED ORDER — ESCITALOPRAM OXALATE 10 MG PO TABS: ORAL_TABLET | ORAL | Status: AC

## 2015-10-01 NOTE — Progress Notes (Signed)
Patient ID: Gabriela Jensen, female   DOB: 04/17/1951, 65 y.o.   MRN: [id number]  Psychiatric Initial Adult Assessment   Patient Identification: Gabriela Jensen MRN:  [id number] Date of Evaluation:  10/01/2015 Referral Source: Primary care Chief Complaint:   Chief Complaint    Follow-up     Visit Diagnosis:    ICD-9-CM ICD-10-CM   1. Moderate episode of recurrent major depressive disorder (HCC) 296.32 F33.1   2. 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 Insomnia 780.52 G47.00    Diagnosis:   Patient Active Problem List   Diagnosis Date Noted  . Complicated bereavement 0000000 08/25/2015  . 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 Systemic infection ([place]) [A41.9] 07/29/2014  . 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 Furuncle of chest wall [L02.223] 07/29/2014  . H/O cataract extraction [Z98.49] 04/29/2014  . 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 Essential (primary) hypertension [I10] 07/08/2013  . Glaucoma [H40.9] 07/08/2013  . OP (osteoporosis) [M81.0] 03/13/2013  . Avitaminosis D [E55.9] 03/06/2013  . Lymphedema of arm [I89.0] 03/04/2013  . Malignant neoplasm of breast [facility]) [C50.919] 04/27/2012   History of Present Illness:  65 years old currently single Caucasian female initially referred by primary care physician for management of grief and depression. She is also seeing her counselor.  Patient has lost a close friend named Bell in 11/16/2012 after which she has suffered from brief prolonged grief and difficult in coping with her depression crying spells. Last visit we added Lexapro the symptoms are improving and she is more positive and is able to distract herself from the grief Aggravating factors; her friend's death in 11-16-2012. Breast cancer in November 17, 2011. Neuropathy related to chemotherapy Modifying factors; her friends she tries to go to the gym Past aggravating factors;  divorced in 11-17-1990 after that she has been in medications for depression. Panic attack in 2001-11-16  Location; Friend's death, depression, anxiety, insomnia Severity of depression: 6/10. 10 being no depression (improving) Context: friends death, anniversaries and birthdays Duration: variable since Nov 17, 1990  Associated Signs/Symptoms: Depression Symptoms:  Sadness better, energy and sleep improving (Hypo) Manic Symptoms:  Distractibility, Anxiety Symptoms:  Excessive Worry, (improving) Psychotic Symptoms:  denies PTSD Symptoms: Had a traumatic exposure:  friends death grief  Past Medical History:  Past Medical History  Diagnosis Date  . Hypertension   . Chronic knee pain     keft  . Hyperlipemia   . Depression     Past Surgical History  Procedure Laterality Date  . Abdominal hysterectomy    . Knee surgery    . Masectomy and reconstruction  Nov 16, 2012 and October 2014   Family History:  Family History  Problem Relation Age of Onset  . Alcohol abuse Father    Social History:   Social History   Social History  . Marital Status: Single    Spouse Name: N/A  . Number of Children: N/A  . Years of Education: N/A   Social History Main Topics  . Smoking status: Never Smoker   . Smokeless tobacco: Never Used  . Alcohol Use: No  . Drug Use: No  . Sexual Activity: Not Currently   Other Topics Concern  . None   Social History Narrative     Musculoskeletal: Strength & Muscle Tone: within normal limits Gait & Station: normal Patient leans: N/A  Psychiatric Specialty Exam: HPI  Review of Systems  Constitutional: Negative for fever.  Musculoskeletal:  Negative for neck pain.  Skin: Negative for rash.  Neurological: Negative for tremors.  Psychiatric/Behavioral: Positive for depression. The patient is nervous/anxious and has insomnia.     Blood pressure 124/62, pulse 73, height 5' 5.5" (1.664 m), weight 228 lb (103.42 kg), SpO2 95 %.Body mass index is 37.35 kg/(m^2).   General Appearance: Casual  Eye Contact:  Fair  Speech:  Normal Rate  Volume:  Normal  Mood:  euthymic  Affect:  reactive  Thought Process:  Coherent  Orientation:  Full (Time, Place, and Person)  Thought Content:  Rumination  Suicidal Thoughts:  No  Homicidal Thoughts:  No  Memory:  Immediate;   Fair Recent;   Fair  Judgement:  Fair  Insight:  Fair  Psychomotor Activity:  Normal  Concentration:  Fair  Recall:  [employer] of Knowledge:Fair  Language: Fair  Akathisia:  Negative  Handed:  Right  AIMS (if indicated):    Assets:  Desire for Improvement  ADL's:  Intact  Cognition: WNL  Sleep:  variable   Is the patient at risk to self?  No. Has the patient been a risk to self in the past 6 months?  No. Has the patient been a risk to self within the distant past?  No.   Allergies:  No Known Allergies Current Medications: Current Outpatient Prescriptions  Medication Sig Dispense Refill  . atenolol (TENORMIN) 25 MG tablet Take 25 mg by mouth daily.    Marland Kitchen buPROPion (WELLBUTRIN) 100 MG tablet Take 1 tablet (100 mg total) by mouth 2 (two) times daily. bid 60 tablet 1  . CALCIUM PO Take 1 tablet by mouth daily.    Marland Kitchen escitalopram (LEXAPRO) 10 MG tablet 1 TAB DAILY 30 tablet 1  . estradiol (ESTRACE) 0.5 MG tablet Take by mouth daily.    Marland Kitchen gabapentin (NEURONTIN) 100 MG capsule Take 500 mg by mouth daily.   0  . ibuprofen (ADVIL,MOTRIN) 200 MG tablet Take 800 mg by mouth 2 (two) times daily.    Marland Kitchen omega-3 acid ethyl esters (LOVAZA) 1 G capsule Take 2 g by mouth 2 (two) times daily.    . simvastatin (ZOCOR) 20 MG tablet Take 20 mg by mouth every evening.     No current facility-administered medications for this visit.    Previous Psychotropic Medications: Yes  Cymbalta; sweating   Substance Abuse History in the last 12 months:  No.  Consequences of Substance Abuse: NA    Treatment Plan Summary: Medication management and Plan as follows  Grief/complicated: continue  Lexapro  10 mg . Continue in addition  Wellbutrin she is taking. No side effects Insomnia: taking neurontin for neuropathy but that is helping her sleep reviewed sleep hygiene. Improving sleep She is a nonsmoker. Patient and non-user of alcohol or drugs Major depressive disorder. May be related to her grief and is improving. Continue Wellbutrin and Lexapro as above More than 50% time spent in counseling and coordination of care and patient education side effects Recommend she continue therapy for now Follow-up in 2 months earlier if there is call 911 or report to the emergency room for any urgent concerns of suicidal thoughts. Time spent: 25 minutes    Mukund Weinreb 3/9/201710:42 AM

## 2015-10-07 ENCOUNTER — Ambulatory Visit (INDEPENDENT_AMBULATORY_CARE_PROVIDER_SITE_OTHER): Payer: 59 | Admitting: Licensed Clinical Social Worker

## 2015-10-07 DIAGNOSIS — Z634 Disappearance and death of family member: Principal | ICD-10-CM

## 2015-10-07 DIAGNOSIS — F4321 Adjustment disorder with depressed mood: Secondary | ICD-10-CM | POA: Diagnosis not present

## 2015-10-07 DIAGNOSIS — F4329 Adjustment disorder with other symptoms: Secondary | ICD-10-CM

## 2015-10-07 NOTE — Progress Notes (Signed)
   THERAPIST PROGRESS NOTE  Session Time: [id number]  Participation Level: Active  Behavioral Response: CasualAlert Euthymic  Type of Therapy: Individual Therapy  Treatment Goals addressed: Coping with thoughts and feelings of grief  Interventions: mindfulness    Suicidal/Homicidal: Denied both  Therapist Interventions:    Gathered information about significant events and changes in mood and functioning since last seen about a month ago.  Expanded on the concept of mindfulness. Described how you can choose to do tasks in a mindful way.  Guided patient through practicing mindfulness in different ways including focusing on an object and mindful breathing.  Encouraged patient to practice the skills regularly in addition to times of distress.   Summary:   Reported that she has been feeling better.  Proud of herself for losing some weight.  Is following a nutrition plan.  Reported sleeping better.  Also said her body has been in less pain.  Noted that she has not been spending as much time thinking about [place] and when she does she is not dwelling on fact that he is gone, but remembering good times together. Read pages therapist provided her about mindfulness.  Indicated she has been implementing some of the mindfulness skills.  Specifically mentioned focusing on her breathing and practicing mindful eating.       Plan: Patient reported she will be away for 3 weeks next month.  Collaboratively decided not to schedule any further appointments at this time as therapist is preparing to go out on maternity leave.  Diagnosis: Complicated Grief    Armandina Stammer 10/07/2015

## 2015-10-09 DIAGNOSIS — G4730 Sleep apnea, unspecified: Secondary | ICD-10-CM | POA: Diagnosis not present

## 2015-10-09 DIAGNOSIS — M5136 Other intervertebral disc degeneration, lumbar region: Secondary | ICD-10-CM | POA: Diagnosis not present

## 2015-10-09 DIAGNOSIS — I10 Essential (primary) hypertension: Secondary | ICD-10-CM | POA: Diagnosis not present

## 2015-10-09 DIAGNOSIS — M8580 Other specified disorders of bone density and structure, unspecified site: Secondary | ICD-10-CM | POA: Diagnosis not present

## 2015-10-09 DIAGNOSIS — E782 Mixed hyperlipidemia: Secondary | ICD-10-CM | POA: Diagnosis not present

## 2015-11-27 ENCOUNTER — Ambulatory Visit (HOSPITAL_COMMUNITY): Payer: Self-pay | Admitting: Psychiatry

## 2015-12-01 ENCOUNTER — Ambulatory Visit (HOSPITAL_COMMUNITY): Payer: Self-pay | Admitting: Psychiatry

## 2016-01-03 ENCOUNTER — Other Ambulatory Visit (HOSPITAL_COMMUNITY): Payer: Self-pay | Admitting: Psychiatry

## 2016-01-04 NOTE — Telephone Encounter (Signed)
Received medication request from [facility] for Lexapro 10mg . Per Dr. De Nurse, medication request is denied. Pt will need to schedule appt with clinic. LVM for pt to return call to clinic.

## 2016-03-18 DIAGNOSIS — M1711 Unilateral primary osteoarthritis, right knee: Secondary | ICD-10-CM | POA: Diagnosis not present

## 2016-04-08 DIAGNOSIS — L03019 Cellulitis of unspecified finger: Secondary | ICD-10-CM | POA: Diagnosis not present

## 2016-04-26 DIAGNOSIS — K219 Gastro-esophageal reflux disease without esophagitis: Secondary | ICD-10-CM | POA: Diagnosis not present

## 2016-04-26 DIAGNOSIS — Z23 Encounter for immunization: Secondary | ICD-10-CM | POA: Diagnosis not present

## 2016-04-26 DIAGNOSIS — Z1231 Encounter for screening mammogram for malignant neoplasm of breast: Secondary | ICD-10-CM | POA: Diagnosis not present

## 2016-04-26 DIAGNOSIS — M899 Disorder of bone, unspecified: Secondary | ICD-10-CM | POA: Diagnosis not present

## 2016-04-26 DIAGNOSIS — E782 Mixed hyperlipidemia: Secondary | ICD-10-CM | POA: Diagnosis not present

## 2016-04-26 DIAGNOSIS — M8580 Other specified disorders of bone density and structure, unspecified site: Secondary | ICD-10-CM | POA: Diagnosis not present

## 2016-04-26 DIAGNOSIS — Z0000 Encounter for general adult medical examination without abnormal findings: Secondary | ICD-10-CM | POA: Diagnosis not present

## 2016-04-26 DIAGNOSIS — I10 Essential (primary) hypertension: Secondary | ICD-10-CM | POA: Diagnosis not present

## 2016-05-05 DIAGNOSIS — T451X5A Adverse effect of antineoplastic and immunosuppressive drugs, initial encounter: Secondary | ICD-10-CM | POA: Diagnosis not present

## 2016-05-05 DIAGNOSIS — I890 Lymphedema, not elsewhere classified: Secondary | ICD-10-CM | POA: Diagnosis not present

## 2016-05-05 DIAGNOSIS — M818 Other osteoporosis without current pathological fracture: Secondary | ICD-10-CM | POA: Diagnosis not present

## 2016-05-05 DIAGNOSIS — Z171 Estrogen receptor negative status [ER-]: Secondary | ICD-10-CM | POA: Diagnosis not present

## 2016-05-05 DIAGNOSIS — C50412 Malignant neoplasm of upper-outer quadrant of left female breast: Secondary | ICD-10-CM | POA: Diagnosis not present

## 2016-05-05 DIAGNOSIS — G620 Drug-induced polyneuropathy: Secondary | ICD-10-CM | POA: Diagnosis not present

## 2016-05-05 DIAGNOSIS — Z86000 Personal history of in-situ neoplasm of breast: Secondary | ICD-10-CM | POA: Diagnosis not present

## 2016-05-05 DIAGNOSIS — Z853 Personal history of malignant neoplasm of breast: Secondary | ICD-10-CM | POA: Diagnosis not present

## 2016-05-05 DIAGNOSIS — Z08 Encounter for follow-up examination after completed treatment for malignant neoplasm: Secondary | ICD-10-CM | POA: Diagnosis not present

## 2016-05-06 DIAGNOSIS — M899 Disorder of bone, unspecified: Secondary | ICD-10-CM | POA: Diagnosis not present

## 2016-05-06 DIAGNOSIS — Z780 Asymptomatic menopausal state: Secondary | ICD-10-CM | POA: Diagnosis not present

## 2016-05-31 DIAGNOSIS — L570 Actinic keratosis: Secondary | ICD-10-CM | POA: Diagnosis not present

## 2016-05-31 DIAGNOSIS — L820 Inflamed seborrheic keratosis: Secondary | ICD-10-CM | POA: Diagnosis not present

## 2016-08-04 DIAGNOSIS — M7752 Other enthesopathy of left foot: Secondary | ICD-10-CM | POA: Diagnosis not present

## 2016-08-04 DIAGNOSIS — G620 Drug-induced polyneuropathy: Secondary | ICD-10-CM | POA: Diagnosis not present

## 2016-08-04 DIAGNOSIS — T451X5A Adverse effect of antineoplastic and immunosuppressive drugs, initial encounter: Secondary | ICD-10-CM | POA: Diagnosis not present

## 2016-08-04 DIAGNOSIS — R269 Unspecified abnormalities of gait and mobility: Secondary | ICD-10-CM | POA: Diagnosis not present

## 2016-08-23 DIAGNOSIS — L650 Telogen effluvium: Secondary | ICD-10-CM | POA: Diagnosis not present

## 2016-08-26 DIAGNOSIS — M1711 Unilateral primary osteoarthritis, right knee: Secondary | ICD-10-CM | POA: Diagnosis not present

## 2016-09-29 ENCOUNTER — Other Ambulatory Visit: Payer: Self-pay | Admitting: Unknown Physician Specialty

## 2016-09-29 ENCOUNTER — Ambulatory Visit (INDEPENDENT_AMBULATORY_CARE_PROVIDER_SITE_OTHER): Payer: Medicare Other

## 2016-09-29 DIAGNOSIS — R0602 Shortness of breath: Secondary | ICD-10-CM

## 2016-09-30 DIAGNOSIS — R7309 Other abnormal glucose: Secondary | ICD-10-CM | POA: Diagnosis not present

## 2016-11-09 DIAGNOSIS — I10 Essential (primary) hypertension: Secondary | ICD-10-CM | POA: Diagnosis not present

## 2016-11-09 DIAGNOSIS — R0602 Shortness of breath: Secondary | ICD-10-CM | POA: Diagnosis not present

## 2016-11-09 DIAGNOSIS — E782 Mixed hyperlipidemia: Secondary | ICD-10-CM | POA: Diagnosis not present

## 2016-12-08 DIAGNOSIS — I517 Cardiomegaly: Secondary | ICD-10-CM | POA: Diagnosis not present

## 2016-12-08 DIAGNOSIS — I080 Rheumatic disorders of both mitral and aortic valves: Secondary | ICD-10-CM | POA: Diagnosis not present

## 2016-12-08 DIAGNOSIS — I519 Heart disease, unspecified: Secondary | ICD-10-CM | POA: Diagnosis not present

## 2016-12-14 DIAGNOSIS — R739 Hyperglycemia, unspecified: Secondary | ICD-10-CM | POA: Diagnosis not present

## 2016-12-14 DIAGNOSIS — K219 Gastro-esophageal reflux disease without esophagitis: Secondary | ICD-10-CM | POA: Diagnosis not present

## 2016-12-14 DIAGNOSIS — I10 Essential (primary) hypertension: Secondary | ICD-10-CM | POA: Diagnosis not present

## 2016-12-14 DIAGNOSIS — E782 Mixed hyperlipidemia: Secondary | ICD-10-CM | POA: Diagnosis not present

## 2016-12-28 DIAGNOSIS — M818 Other osteoporosis without current pathological fracture: Secondary | ICD-10-CM | POA: Diagnosis not present

## 2016-12-28 DIAGNOSIS — I340 Nonrheumatic mitral (valve) insufficiency: Secondary | ICD-10-CM | POA: Diagnosis not present

## 2016-12-28 DIAGNOSIS — T451X5A Adverse effect of antineoplastic and immunosuppressive drugs, initial encounter: Secondary | ICD-10-CM | POA: Diagnosis not present

## 2016-12-28 DIAGNOSIS — C50412 Malignant neoplasm of upper-outer quadrant of left female breast: Secondary | ICD-10-CM | POA: Diagnosis not present

## 2016-12-28 DIAGNOSIS — M810 Age-related osteoporosis without current pathological fracture: Secondary | ICD-10-CM | POA: Diagnosis not present

## 2016-12-28 DIAGNOSIS — Z08 Encounter for follow-up examination after completed treatment for malignant neoplasm: Secondary | ICD-10-CM | POA: Diagnosis not present

## 2016-12-28 DIAGNOSIS — G620 Drug-induced polyneuropathy: Secondary | ICD-10-CM | POA: Diagnosis not present

## 2016-12-28 DIAGNOSIS — Z803 Family history of malignant neoplasm of breast: Secondary | ICD-10-CM | POA: Diagnosis not present

## 2016-12-28 DIAGNOSIS — Z853 Personal history of malignant neoplasm of breast: Secondary | ICD-10-CM | POA: Diagnosis not present

## 2016-12-28 DIAGNOSIS — Z800 Family history of malignant neoplasm of digestive organs: Secondary | ICD-10-CM | POA: Diagnosis not present

## 2016-12-28 DIAGNOSIS — Z171 Estrogen receptor negative status [ER-]: Secondary | ICD-10-CM | POA: Diagnosis not present

## 2016-12-28 DIAGNOSIS — T451X5S Adverse effect of antineoplastic and immunosuppressive drugs, sequela: Secondary | ICD-10-CM | POA: Diagnosis not present

## 2017-01-12 DIAGNOSIS — Z79899 Other long term (current) drug therapy: Secondary | ICD-10-CM | POA: Diagnosis not present

## 2017-01-13 DIAGNOSIS — R0602 Shortness of breath: Secondary | ICD-10-CM | POA: Diagnosis not present

## 2017-01-13 DIAGNOSIS — I10 Essential (primary) hypertension: Secondary | ICD-10-CM | POA: Diagnosis not present

## 2017-01-13 DIAGNOSIS — R9439 Abnormal result of other cardiovascular function study: Secondary | ICD-10-CM | POA: Diagnosis not present

## 2017-01-17 DIAGNOSIS — L820 Inflamed seborrheic keratosis: Secondary | ICD-10-CM | POA: Diagnosis not present

## 2017-01-17 DIAGNOSIS — L570 Actinic keratosis: Secondary | ICD-10-CM | POA: Diagnosis not present

## 2017-02-02 DIAGNOSIS — T451X5A Adverse effect of antineoplastic and immunosuppressive drugs, initial encounter: Secondary | ICD-10-CM | POA: Diagnosis not present

## 2017-02-02 DIAGNOSIS — G620 Drug-induced polyneuropathy: Secondary | ICD-10-CM | POA: Diagnosis not present

## 2017-02-02 DIAGNOSIS — I10 Essential (primary) hypertension: Secondary | ICD-10-CM | POA: Diagnosis not present

## 2017-02-02 DIAGNOSIS — M7751 Other enthesopathy of right foot: Secondary | ICD-10-CM | POA: Diagnosis not present

## 2017-02-02 DIAGNOSIS — M7752 Other enthesopathy of left foot: Secondary | ICD-10-CM | POA: Diagnosis not present

## 2017-02-12 DIAGNOSIS — Z79899 Other long term (current) drug therapy: Secondary | ICD-10-CM | POA: Diagnosis not present

## 2017-02-12 DIAGNOSIS — I10 Essential (primary) hypertension: Secondary | ICD-10-CM | POA: Diagnosis not present

## 2017-02-12 DIAGNOSIS — I83891 Varicose veins of right lower extremities with other complications: Secondary | ICD-10-CM | POA: Diagnosis not present

## 2017-02-12 DIAGNOSIS — Z7983 Long term (current) use of bisphosphonates: Secondary | ICD-10-CM | POA: Diagnosis not present

## 2017-05-10 DIAGNOSIS — R5383 Other fatigue: Secondary | ICD-10-CM | POA: Diagnosis not present

## 2017-05-10 DIAGNOSIS — E782 Mixed hyperlipidemia: Secondary | ICD-10-CM | POA: Diagnosis not present

## 2017-05-10 DIAGNOSIS — Z23 Encounter for immunization: Secondary | ICD-10-CM | POA: Diagnosis not present

## 2017-05-10 DIAGNOSIS — Z0000 Encounter for general adult medical examination without abnormal findings: Secondary | ICD-10-CM | POA: Diagnosis not present

## 2017-05-10 DIAGNOSIS — M5136 Other intervertebral disc degeneration, lumbar region: Secondary | ICD-10-CM | POA: Diagnosis not present

## 2017-05-10 DIAGNOSIS — Z1231 Encounter for screening mammogram for malignant neoplasm of breast: Secondary | ICD-10-CM | POA: Diagnosis not present

## 2017-05-10 DIAGNOSIS — M899 Disorder of bone, unspecified: Secondary | ICD-10-CM | POA: Diagnosis not present

## 2017-05-10 DIAGNOSIS — I10 Essential (primary) hypertension: Secondary | ICD-10-CM | POA: Diagnosis not present

## 2017-05-10 DIAGNOSIS — R739 Hyperglycemia, unspecified: Secondary | ICD-10-CM | POA: Diagnosis not present

## 2017-05-10 DIAGNOSIS — M8580 Other specified disorders of bone density and structure, unspecified site: Secondary | ICD-10-CM | POA: Diagnosis not present

## 2017-05-10 DIAGNOSIS — Z01419 Encounter for gynecological examination (general) (routine) without abnormal findings: Secondary | ICD-10-CM | POA: Diagnosis not present

## 2017-06-28 DIAGNOSIS — H25012 Cortical age-related cataract, left eye: Secondary | ICD-10-CM | POA: Diagnosis not present

## 2017-06-28 DIAGNOSIS — Z961 Presence of intraocular lens: Secondary | ICD-10-CM | POA: Diagnosis not present

## 2017-06-28 DIAGNOSIS — H2512 Age-related nuclear cataract, left eye: Secondary | ICD-10-CM | POA: Diagnosis not present

## 2017-08-16 DIAGNOSIS — G620 Drug-induced polyneuropathy: Secondary | ICD-10-CM | POA: Diagnosis not present

## 2017-08-16 DIAGNOSIS — I10 Essential (primary) hypertension: Secondary | ICD-10-CM | POA: Diagnosis not present

## 2017-08-16 DIAGNOSIS — Z86000 Personal history of in-situ neoplasm of breast: Secondary | ICD-10-CM | POA: Diagnosis not present

## 2017-08-16 DIAGNOSIS — E559 Vitamin D deficiency, unspecified: Secondary | ICD-10-CM | POA: Diagnosis not present

## 2017-08-16 DIAGNOSIS — Z853 Personal history of malignant neoplasm of breast: Secondary | ICD-10-CM | POA: Diagnosis not present

## 2017-08-16 DIAGNOSIS — Z08 Encounter for follow-up examination after completed treatment for malignant neoplasm: Secondary | ICD-10-CM | POA: Diagnosis not present

## 2017-08-16 DIAGNOSIS — C50412 Malignant neoplasm of upper-outer quadrant of left female breast: Secondary | ICD-10-CM | POA: Diagnosis not present

## 2017-08-16 DIAGNOSIS — Z9189 Other specified personal risk factors, not elsewhere classified: Secondary | ICD-10-CM | POA: Diagnosis not present

## 2017-08-16 DIAGNOSIS — I890 Lymphedema, not elsewhere classified: Secondary | ICD-10-CM | POA: Diagnosis not present

## 2017-08-16 DIAGNOSIS — T451X5A Adverse effect of antineoplastic and immunosuppressive drugs, initial encounter: Secondary | ICD-10-CM | POA: Diagnosis not present

## 2017-08-16 DIAGNOSIS — Z171 Estrogen receptor negative status [ER-]: Secondary | ICD-10-CM | POA: Diagnosis not present

## 2017-08-23 DIAGNOSIS — L821 Other seborrheic keratosis: Secondary | ICD-10-CM | POA: Diagnosis not present

## 2017-08-23 DIAGNOSIS — D1801 Hemangioma of skin and subcutaneous tissue: Secondary | ICD-10-CM | POA: Diagnosis not present

## 2017-08-23 DIAGNOSIS — L570 Actinic keratosis: Secondary | ICD-10-CM | POA: Diagnosis not present

## 2017-08-23 DIAGNOSIS — D225 Melanocytic nevi of trunk: Secondary | ICD-10-CM | POA: Diagnosis not present

## 2017-11-06 DIAGNOSIS — Z86010 Personal history of colonic polyps: Secondary | ICD-10-CM | POA: Diagnosis not present

## 2017-11-06 DIAGNOSIS — K219 Gastro-esophageal reflux disease without esophagitis: Secondary | ICD-10-CM | POA: Diagnosis not present

## 2017-11-06 DIAGNOSIS — R194 Change in bowel habit: Secondary | ICD-10-CM | POA: Diagnosis not present

## 2017-11-06 DIAGNOSIS — I10 Essential (primary) hypertension: Secondary | ICD-10-CM | POA: Diagnosis not present

## 2017-11-06 DIAGNOSIS — K222 Esophageal obstruction: Secondary | ICD-10-CM | POA: Diagnosis not present

## 2017-11-07 DIAGNOSIS — E782 Mixed hyperlipidemia: Secondary | ICD-10-CM | POA: Diagnosis not present

## 2017-11-07 DIAGNOSIS — R739 Hyperglycemia, unspecified: Secondary | ICD-10-CM | POA: Diagnosis not present

## 2017-11-07 DIAGNOSIS — E039 Hypothyroidism, unspecified: Secondary | ICD-10-CM | POA: Diagnosis not present

## 2017-11-07 DIAGNOSIS — I10 Essential (primary) hypertension: Secondary | ICD-10-CM | POA: Diagnosis not present

## 2017-11-09 DIAGNOSIS — L821 Other seborrheic keratosis: Secondary | ICD-10-CM | POA: Diagnosis not present

## 2017-11-09 DIAGNOSIS — L650 Telogen effluvium: Secondary | ICD-10-CM | POA: Diagnosis not present

## 2017-11-09 DIAGNOSIS — L218 Other seborrheic dermatitis: Secondary | ICD-10-CM | POA: Diagnosis not present

## 2017-11-16 DIAGNOSIS — I10 Essential (primary) hypertension: Secondary | ICD-10-CM | POA: Diagnosis not present

## 2017-11-16 DIAGNOSIS — E782 Mixed hyperlipidemia: Secondary | ICD-10-CM | POA: Diagnosis not present

## 2017-11-16 DIAGNOSIS — R945 Abnormal results of liver function studies: Secondary | ICD-10-CM | POA: Diagnosis not present

## 2017-11-16 DIAGNOSIS — E039 Hypothyroidism, unspecified: Secondary | ICD-10-CM | POA: Diagnosis not present

## 2017-11-16 DIAGNOSIS — R739 Hyperglycemia, unspecified: Secondary | ICD-10-CM | POA: Diagnosis not present

## 2017-11-22 DIAGNOSIS — M1711 Unilateral primary osteoarthritis, right knee: Secondary | ICD-10-CM | POA: Diagnosis not present

## 2017-11-22 DIAGNOSIS — M25561 Pain in right knee: Secondary | ICD-10-CM | POA: Diagnosis not present

## 2018-05-11 DIAGNOSIS — R739 Hyperglycemia, unspecified: Secondary | ICD-10-CM | POA: Diagnosis not present

## 2018-05-11 DIAGNOSIS — E039 Hypothyroidism, unspecified: Secondary | ICD-10-CM | POA: Diagnosis not present

## 2018-05-11 DIAGNOSIS — M8580 Other specified disorders of bone density and structure, unspecified site: Secondary | ICD-10-CM | POA: Diagnosis not present

## 2018-05-11 DIAGNOSIS — Z23 Encounter for immunization: Secondary | ICD-10-CM | POA: Diagnosis not present

## 2018-05-11 DIAGNOSIS — Z0000 Encounter for general adult medical examination without abnormal findings: Secondary | ICD-10-CM | POA: Diagnosis not present

## 2018-05-11 DIAGNOSIS — Z1231 Encounter for screening mammogram for malignant neoplasm of breast: Secondary | ICD-10-CM | POA: Diagnosis not present

## 2018-05-11 DIAGNOSIS — E782 Mixed hyperlipidemia: Secondary | ICD-10-CM | POA: Diagnosis not present

## 2018-05-11 DIAGNOSIS — R5383 Other fatigue: Secondary | ICD-10-CM | POA: Diagnosis not present

## 2018-05-11 DIAGNOSIS — K219 Gastro-esophageal reflux disease without esophagitis: Secondary | ICD-10-CM | POA: Diagnosis not present

## 2018-05-29 DIAGNOSIS — L821 Other seborrheic keratosis: Secondary | ICD-10-CM | POA: Diagnosis not present

## 2018-05-29 DIAGNOSIS — L218 Other seborrheic dermatitis: Secondary | ICD-10-CM | POA: Diagnosis not present

## 2018-05-29 DIAGNOSIS — L820 Inflamed seborrheic keratosis: Secondary | ICD-10-CM | POA: Diagnosis not present

## 2018-05-29 DIAGNOSIS — B372 Candidiasis of skin and nail: Secondary | ICD-10-CM | POA: Diagnosis not present

## 2018-05-29 DIAGNOSIS — L300 Nummular dermatitis: Secondary | ICD-10-CM | POA: Diagnosis not present

## 2018-05-30 DIAGNOSIS — I8311 Varicose veins of right lower extremity with inflammation: Secondary | ICD-10-CM | POA: Diagnosis not present

## 2018-05-30 DIAGNOSIS — R58 Hemorrhage, not elsewhere classified: Secondary | ICD-10-CM | POA: Diagnosis not present

## 2018-05-30 DIAGNOSIS — I83811 Varicose veins of right lower extremities with pain: Secondary | ICD-10-CM | POA: Diagnosis not present

## 2018-06-08 DIAGNOSIS — Z13820 Encounter for screening for osteoporosis: Secondary | ICD-10-CM | POA: Diagnosis not present

## 2018-06-08 DIAGNOSIS — M85851 Other specified disorders of bone density and structure, right thigh: Secondary | ICD-10-CM | POA: Diagnosis not present

## 2018-06-08 DIAGNOSIS — M85832 Other specified disorders of bone density and structure, left forearm: Secondary | ICD-10-CM | POA: Diagnosis not present

## 2018-06-08 DIAGNOSIS — Z780 Asymptomatic menopausal state: Secondary | ICD-10-CM | POA: Diagnosis not present

## 2018-07-07 IMAGING — DX DG CHEST 2V
2 series · 2 of 2 positions shown · non-contrast
Comparison: None.

CLINICAL DATA: Shortness of breath.

EXAM:
CHEST  2 VIEW

[chest pa]
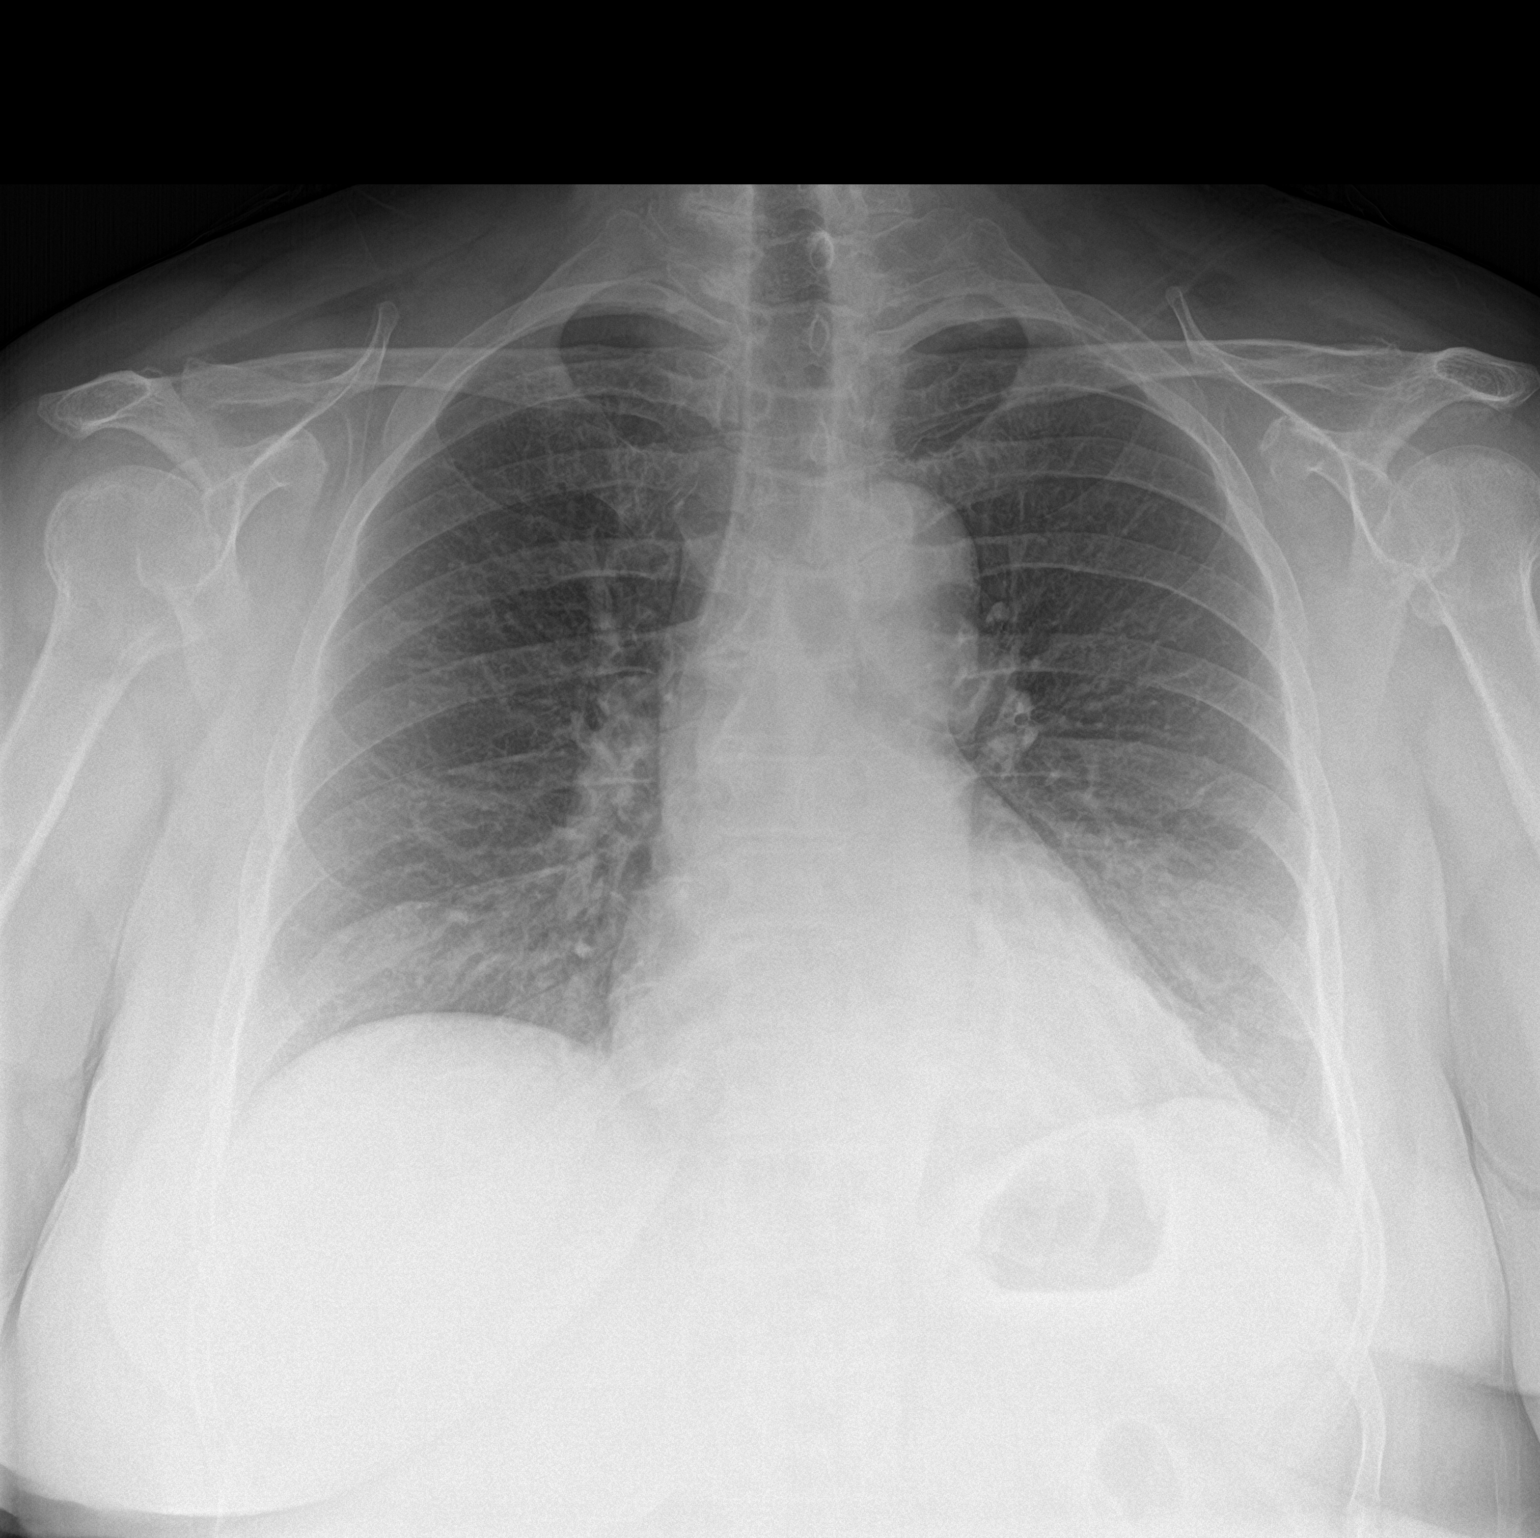

[chest lat]
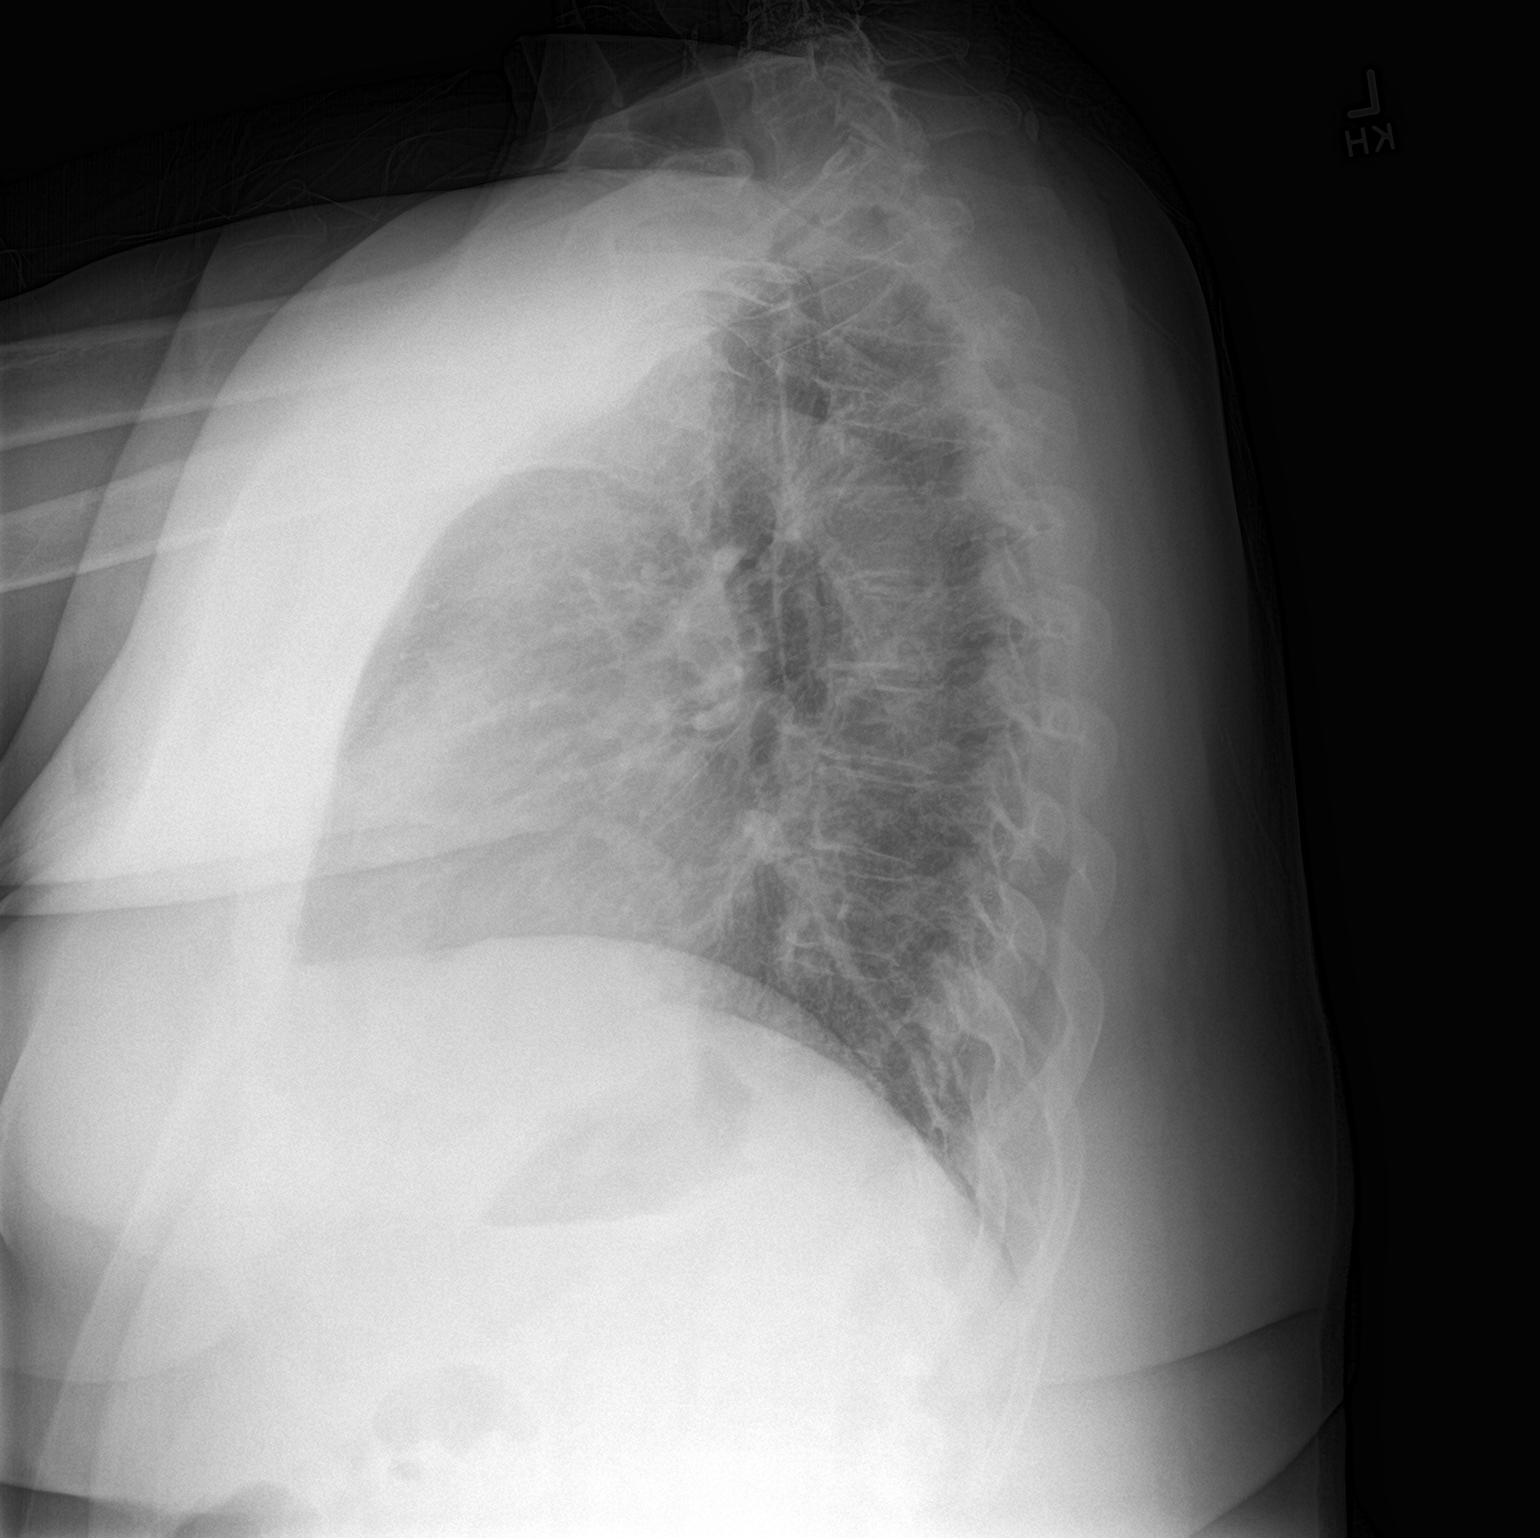

[2 of 2 positions shown; findings below may reference images not displayed]

FINDINGS: The heart size and mediastinal contours are within normal limits.
Both lungs are clear. No pneumothorax or pleural effusion is noted.
The visualized skeletal structures are unremarkable.
IMPRESSION: No active cardiopulmonary disease.

## 2018-07-10 DIAGNOSIS — D123 Benign neoplasm of transverse colon: Secondary | ICD-10-CM | POA: Diagnosis not present

## 2018-07-10 DIAGNOSIS — D122 Benign neoplasm of ascending colon: Secondary | ICD-10-CM | POA: Diagnosis not present

## 2018-07-10 DIAGNOSIS — Z86010 Personal history of colonic polyps: Secondary | ICD-10-CM | POA: Diagnosis not present

## 2018-07-10 DIAGNOSIS — K5730 Diverticulosis of large intestine without perforation or abscess without bleeding: Secondary | ICD-10-CM | POA: Diagnosis not present

## 2018-08-09 DIAGNOSIS — R350 Frequency of micturition: Secondary | ICD-10-CM | POA: Diagnosis not present

## 2018-08-09 DIAGNOSIS — R3915 Urgency of urination: Secondary | ICD-10-CM | POA: Diagnosis not present

## 2018-08-10 DIAGNOSIS — R3915 Urgency of urination: Secondary | ICD-10-CM | POA: Diagnosis not present

## 2018-08-10 DIAGNOSIS — R350 Frequency of micturition: Secondary | ICD-10-CM | POA: Diagnosis not present

## 2018-08-23 DIAGNOSIS — G8929 Other chronic pain: Secondary | ICD-10-CM | POA: Diagnosis not present

## 2018-08-23 DIAGNOSIS — M1288 Other specific arthropathies, not elsewhere classified, other specified site: Secondary | ICD-10-CM | POA: Diagnosis not present

## 2018-08-23 DIAGNOSIS — R269 Unspecified abnormalities of gait and mobility: Secondary | ICD-10-CM | POA: Diagnosis not present

## 2018-08-23 DIAGNOSIS — M5440 Lumbago with sciatica, unspecified side: Secondary | ICD-10-CM | POA: Diagnosis not present

## 2018-08-23 DIAGNOSIS — R2689 Other abnormalities of gait and mobility: Secondary | ICD-10-CM | POA: Diagnosis not present

## 2018-08-23 DIAGNOSIS — M5116 Intervertebral disc disorders with radiculopathy, lumbar region: Secondary | ICD-10-CM | POA: Diagnosis not present

## 2018-08-23 DIAGNOSIS — M5137 Other intervertebral disc degeneration, lumbosacral region: Secondary | ICD-10-CM | POA: Diagnosis not present

## 2018-08-23 DIAGNOSIS — M4316 Spondylolisthesis, lumbar region: Secondary | ICD-10-CM | POA: Diagnosis not present

## 2018-08-24 DIAGNOSIS — H25012 Cortical age-related cataract, left eye: Secondary | ICD-10-CM | POA: Diagnosis not present

## 2018-08-24 DIAGNOSIS — H40013 Open angle with borderline findings, low risk, bilateral: Secondary | ICD-10-CM | POA: Diagnosis not present

## 2018-08-24 DIAGNOSIS — Z961 Presence of intraocular lens: Secondary | ICD-10-CM | POA: Diagnosis not present

## 2018-08-24 DIAGNOSIS — H2512 Age-related nuclear cataract, left eye: Secondary | ICD-10-CM | POA: Diagnosis not present

## 2018-09-08 DIAGNOSIS — M48061 Spinal stenosis, lumbar region without neurogenic claudication: Secondary | ICD-10-CM | POA: Diagnosis not present

## 2018-09-11 DIAGNOSIS — G620 Drug-induced polyneuropathy: Secondary | ICD-10-CM | POA: Diagnosis not present

## 2018-09-11 DIAGNOSIS — C50412 Malignant neoplasm of upper-outer quadrant of left female breast: Secondary | ICD-10-CM | POA: Diagnosis not present

## 2018-09-11 DIAGNOSIS — T451X5A Adverse effect of antineoplastic and immunosuppressive drugs, initial encounter: Secondary | ICD-10-CM | POA: Diagnosis not present

## 2018-09-11 DIAGNOSIS — Z171 Estrogen receptor negative status [ER-]: Secondary | ICD-10-CM | POA: Diagnosis not present

## 2018-09-11 DIAGNOSIS — M818 Other osteoporosis without current pathological fracture: Secondary | ICD-10-CM | POA: Diagnosis not present

## 2018-09-11 DIAGNOSIS — E559 Vitamin D deficiency, unspecified: Secondary | ICD-10-CM | POA: Diagnosis not present

## 2018-09-11 DIAGNOSIS — Z9189 Other specified personal risk factors, not elsewhere classified: Secondary | ICD-10-CM | POA: Diagnosis not present

## 2018-09-11 DIAGNOSIS — M810 Age-related osteoporosis without current pathological fracture: Secondary | ICD-10-CM | POA: Diagnosis not present

## 2018-09-11 DIAGNOSIS — I10 Essential (primary) hypertension: Secondary | ICD-10-CM | POA: Diagnosis not present

## 2018-09-18 DIAGNOSIS — M4316 Spondylolisthesis, lumbar region: Secondary | ICD-10-CM | POA: Diagnosis not present

## 2018-09-18 DIAGNOSIS — M48061 Spinal stenosis, lumbar region without neurogenic claudication: Secondary | ICD-10-CM | POA: Diagnosis not present

## 2018-09-19 DIAGNOSIS — G8929 Other chronic pain: Secondary | ICD-10-CM | POA: Diagnosis not present

## 2018-09-19 DIAGNOSIS — M5440 Lumbago with sciatica, unspecified side: Secondary | ICD-10-CM | POA: Diagnosis not present

## 2018-09-19 DIAGNOSIS — M79652 Pain in left thigh: Secondary | ICD-10-CM | POA: Diagnosis not present

## 2018-09-19 DIAGNOSIS — M79651 Pain in right thigh: Secondary | ICD-10-CM | POA: Diagnosis not present

## 2018-09-24 DIAGNOSIS — K760 Fatty (change of) liver, not elsewhere classified: Secondary | ICD-10-CM | POA: Diagnosis not present

## 2018-09-24 DIAGNOSIS — K7689 Other specified diseases of liver: Secondary | ICD-10-CM | POA: Diagnosis not present

## 2018-09-24 DIAGNOSIS — C50412 Malignant neoplasm of upper-outer quadrant of left female breast: Secondary | ICD-10-CM | POA: Diagnosis not present

## 2018-09-24 DIAGNOSIS — K824 Cholesterolosis of gallbladder: Secondary | ICD-10-CM | POA: Diagnosis not present

## 2018-09-24 DIAGNOSIS — Z171 Estrogen receptor negative status [ER-]: Secondary | ICD-10-CM | POA: Diagnosis not present

## 2018-09-24 DIAGNOSIS — R932 Abnormal findings on diagnostic imaging of liver and biliary tract: Secondary | ICD-10-CM | POA: Diagnosis not present

## 2018-10-02 DIAGNOSIS — G8929 Other chronic pain: Secondary | ICD-10-CM | POA: Diagnosis not present

## 2018-10-02 DIAGNOSIS — M79652 Pain in left thigh: Secondary | ICD-10-CM | POA: Diagnosis not present

## 2018-10-02 DIAGNOSIS — M5440 Lumbago with sciatica, unspecified side: Secondary | ICD-10-CM | POA: Diagnosis not present

## 2018-10-02 DIAGNOSIS — M79651 Pain in right thigh: Secondary | ICD-10-CM | POA: Diagnosis not present

## 2018-10-10 DIAGNOSIS — Z7952 Long term (current) use of systemic steroids: Secondary | ICD-10-CM | POA: Diagnosis not present

## 2018-10-10 DIAGNOSIS — M1711 Unilateral primary osteoarthritis, right knee: Secondary | ICD-10-CM | POA: Diagnosis not present

## 2018-10-30 DIAGNOSIS — E782 Mixed hyperlipidemia: Secondary | ICD-10-CM | POA: Diagnosis not present

## 2018-10-30 DIAGNOSIS — E039 Hypothyroidism, unspecified: Secondary | ICD-10-CM | POA: Diagnosis not present

## 2018-10-30 DIAGNOSIS — R739 Hyperglycemia, unspecified: Secondary | ICD-10-CM | POA: Diagnosis not present

## 2018-10-30 DIAGNOSIS — I10 Essential (primary) hypertension: Secondary | ICD-10-CM | POA: Diagnosis not present

## 2018-10-30 DIAGNOSIS — R945 Abnormal results of liver function studies: Secondary | ICD-10-CM | POA: Diagnosis not present

## 2018-11-12 DIAGNOSIS — G9520 Unspecified cord compression: Secondary | ICD-10-CM | POA: Diagnosis not present

## 2018-11-12 DIAGNOSIS — M48061 Spinal stenosis, lumbar region without neurogenic claudication: Secondary | ICD-10-CM | POA: Diagnosis not present

## 2018-11-12 DIAGNOSIS — R269 Unspecified abnormalities of gait and mobility: Secondary | ICD-10-CM | POA: Diagnosis not present

## 2018-11-12 DIAGNOSIS — R531 Weakness: Secondary | ICD-10-CM | POA: Diagnosis not present

## 2018-11-12 DIAGNOSIS — M4316 Spondylolisthesis, lumbar region: Secondary | ICD-10-CM | POA: Diagnosis not present

## 2018-11-22 DIAGNOSIS — Z1159 Encounter for screening for other viral diseases: Secondary | ICD-10-CM | POA: Diagnosis not present

## 2018-11-27 DIAGNOSIS — M1990 Unspecified osteoarthritis, unspecified site: Secondary | ICD-10-CM | POA: Diagnosis not present

## 2018-11-27 DIAGNOSIS — G629 Polyneuropathy, unspecified: Secondary | ICD-10-CM | POA: Diagnosis not present

## 2018-11-27 DIAGNOSIS — M4316 Spondylolisthesis, lumbar region: Secondary | ICD-10-CM | POA: Diagnosis not present

## 2018-11-27 DIAGNOSIS — E270 Other adrenocortical overactivity: Secondary | ICD-10-CM | POA: Diagnosis not present

## 2018-11-27 DIAGNOSIS — M5416 Radiculopathy, lumbar region: Secondary | ICD-10-CM | POA: Diagnosis not present

## 2018-11-27 DIAGNOSIS — E7849 Other hyperlipidemia: Secondary | ICD-10-CM | POA: Diagnosis not present

## 2018-11-27 DIAGNOSIS — G8929 Other chronic pain: Secondary | ICD-10-CM | POA: Diagnosis not present

## 2018-11-27 DIAGNOSIS — Z171 Estrogen receptor negative status [ER-]: Secondary | ICD-10-CM | POA: Diagnosis not present

## 2018-11-27 DIAGNOSIS — M810 Age-related osteoporosis without current pathological fracture: Secondary | ICD-10-CM | POA: Diagnosis not present

## 2018-11-27 DIAGNOSIS — M48061 Spinal stenosis, lumbar region without neurogenic claudication: Secondary | ICD-10-CM | POA: Diagnosis not present

## 2018-11-27 DIAGNOSIS — H409 Unspecified glaucoma: Secondary | ICD-10-CM | POA: Diagnosis not present

## 2018-11-27 DIAGNOSIS — Z9221 Personal history of antineoplastic chemotherapy: Secondary | ICD-10-CM | POA: Diagnosis not present

## 2018-11-27 DIAGNOSIS — E559 Vitamin D deficiency, unspecified: Secondary | ICD-10-CM | POA: Diagnosis not present

## 2018-11-27 DIAGNOSIS — Z79899 Other long term (current) drug therapy: Secondary | ICD-10-CM | POA: Diagnosis not present

## 2018-11-27 DIAGNOSIS — Z853 Personal history of malignant neoplasm of breast: Secondary | ICD-10-CM | POA: Diagnosis not present

## 2018-11-27 DIAGNOSIS — M4726 Other spondylosis with radiculopathy, lumbar region: Secondary | ICD-10-CM | POA: Diagnosis not present

## 2018-11-27 DIAGNOSIS — Z9013 Acquired absence of bilateral breasts and nipples: Secondary | ICD-10-CM | POA: Diagnosis not present

## 2018-11-27 DIAGNOSIS — Z96652 Presence of left artificial knee joint: Secondary | ICD-10-CM | POA: Diagnosis not present

## 2018-11-27 DIAGNOSIS — Z923 Personal history of irradiation: Secondary | ICD-10-CM | POA: Diagnosis not present

## 2018-11-27 DIAGNOSIS — I10 Essential (primary) hypertension: Secondary | ICD-10-CM | POA: Diagnosis not present

## 2018-11-27 DIAGNOSIS — Z9181 History of falling: Secondary | ICD-10-CM | POA: Diagnosis not present

## 2018-12-11 DIAGNOSIS — Z981 Arthrodesis status: Secondary | ICD-10-CM | POA: Diagnosis not present

## 2018-12-11 DIAGNOSIS — Z4789 Encounter for other orthopedic aftercare: Secondary | ICD-10-CM | POA: Diagnosis not present

## 2018-12-21 DIAGNOSIS — Z981 Arthrodesis status: Secondary | ICD-10-CM | POA: Diagnosis not present

## 2018-12-21 DIAGNOSIS — Z4789 Encounter for other orthopedic aftercare: Secondary | ICD-10-CM | POA: Diagnosis not present

## 2018-12-21 DIAGNOSIS — Z4802 Encounter for removal of sutures: Secondary | ICD-10-CM | POA: Diagnosis not present

## 2019-01-07 DIAGNOSIS — Z981 Arthrodesis status: Secondary | ICD-10-CM | POA: Diagnosis not present

## 2019-01-07 DIAGNOSIS — Z4789 Encounter for other orthopedic aftercare: Secondary | ICD-10-CM | POA: Diagnosis not present

## 2019-02-20 DIAGNOSIS — Z961 Presence of intraocular lens: Secondary | ICD-10-CM | POA: Diagnosis not present

## 2019-02-20 DIAGNOSIS — H40013 Open angle with borderline findings, low risk, bilateral: Secondary | ICD-10-CM | POA: Diagnosis not present

## 2019-02-20 DIAGNOSIS — H25012 Cortical age-related cataract, left eye: Secondary | ICD-10-CM | POA: Diagnosis not present

## 2019-02-20 DIAGNOSIS — H2512 Age-related nuclear cataract, left eye: Secondary | ICD-10-CM | POA: Diagnosis not present

## 2019-02-22 DIAGNOSIS — B370 Candidal stomatitis: Secondary | ICD-10-CM | POA: Diagnosis not present

## 2019-02-22 DIAGNOSIS — K130 Diseases of lips: Secondary | ICD-10-CM | POA: Diagnosis not present

## 2019-03-12 DIAGNOSIS — L659 Nonscarring hair loss, unspecified: Secondary | ICD-10-CM | POA: Diagnosis not present

## 2019-03-12 DIAGNOSIS — Z9189 Other specified personal risk factors, not elsewhere classified: Secondary | ICD-10-CM | POA: Diagnosis not present

## 2019-03-12 DIAGNOSIS — Z171 Estrogen receptor negative status [ER-]: Secondary | ICD-10-CM | POA: Diagnosis not present

## 2019-03-12 DIAGNOSIS — K7689 Other specified diseases of liver: Secondary | ICD-10-CM | POA: Diagnosis not present

## 2019-03-12 DIAGNOSIS — I129 Hypertensive chronic kidney disease with stage 1 through stage 4 chronic kidney disease, or unspecified chronic kidney disease: Secondary | ICD-10-CM | POA: Diagnosis not present

## 2019-03-12 DIAGNOSIS — Z79899 Other long term (current) drug therapy: Secondary | ICD-10-CM | POA: Diagnosis not present

## 2019-03-12 DIAGNOSIS — K760 Fatty (change of) liver, not elsewhere classified: Secondary | ICD-10-CM | POA: Diagnosis not present

## 2019-03-12 DIAGNOSIS — I509 Heart failure, unspecified: Secondary | ICD-10-CM | POA: Diagnosis not present

## 2019-03-12 DIAGNOSIS — R5383 Other fatigue: Secondary | ICD-10-CM | POA: Diagnosis not present

## 2019-03-12 DIAGNOSIS — I10 Essential (primary) hypertension: Secondary | ICD-10-CM | POA: Diagnosis not present

## 2019-03-12 DIAGNOSIS — Q446 Cystic disease of liver: Secondary | ICD-10-CM | POA: Diagnosis not present

## 2019-03-12 DIAGNOSIS — C50412 Malignant neoplasm of upper-outer quadrant of left female breast: Secondary | ICD-10-CM | POA: Diagnosis not present

## 2019-03-21 DIAGNOSIS — L821 Other seborrheic keratosis: Secondary | ICD-10-CM | POA: Diagnosis not present

## 2019-03-21 DIAGNOSIS — B372 Candidiasis of skin and nail: Secondary | ICD-10-CM | POA: Diagnosis not present

## 2019-03-21 DIAGNOSIS — L820 Inflamed seborrheic keratosis: Secondary | ICD-10-CM | POA: Diagnosis not present

## 2019-04-08 DIAGNOSIS — Z981 Arthrodesis status: Secondary | ICD-10-CM | POA: Diagnosis not present

## 2019-04-08 DIAGNOSIS — Z4789 Encounter for other orthopedic aftercare: Secondary | ICD-10-CM | POA: Diagnosis not present

## 2019-04-08 DIAGNOSIS — Z9181 History of falling: Secondary | ICD-10-CM | POA: Diagnosis not present

## 2019-08-21 DIAGNOSIS — N390 Urinary tract infection, site not specified: Secondary | ICD-10-CM | POA: Diagnosis not present

## 2019-08-21 DIAGNOSIS — R1032 Left lower quadrant pain: Secondary | ICD-10-CM | POA: Diagnosis not present

## 2019-08-21 DIAGNOSIS — Z20822 Contact with and (suspected) exposure to covid-19: Secondary | ICD-10-CM | POA: Diagnosis not present

## 2019-08-22 DIAGNOSIS — R1012 Left upper quadrant pain: Secondary | ICD-10-CM | POA: Diagnosis not present

## 2019-08-22 DIAGNOSIS — K5732 Diverticulitis of large intestine without perforation or abscess without bleeding: Secondary | ICD-10-CM | POA: Diagnosis not present

## 2019-08-22 DIAGNOSIS — R350 Frequency of micturition: Secondary | ICD-10-CM | POA: Diagnosis not present

## 2019-08-22 DIAGNOSIS — R3915 Urgency of urination: Secondary | ICD-10-CM | POA: Diagnosis not present

## 2019-08-22 DIAGNOSIS — R1032 Left lower quadrant pain: Secondary | ICD-10-CM | POA: Diagnosis not present

## 2019-09-02 DIAGNOSIS — R413 Other amnesia: Secondary | ICD-10-CM | POA: Diagnosis not present

## 2019-09-17 DIAGNOSIS — L570 Actinic keratosis: Secondary | ICD-10-CM | POA: Diagnosis not present

## 2019-09-17 DIAGNOSIS — L853 Xerosis cutis: Secondary | ICD-10-CM | POA: Diagnosis not present

## 2019-09-17 DIAGNOSIS — L820 Inflamed seborrheic keratosis: Secondary | ICD-10-CM | POA: Diagnosis not present

## 2019-09-17 DIAGNOSIS — L821 Other seborrheic keratosis: Secondary | ICD-10-CM | POA: Diagnosis not present

## 2019-09-26 DIAGNOSIS — Z923 Personal history of irradiation: Secondary | ICD-10-CM | POA: Diagnosis not present

## 2019-09-26 DIAGNOSIS — G3184 Mild cognitive impairment, so stated: Secondary | ICD-10-CM | POA: Diagnosis not present

## 2019-09-26 DIAGNOSIS — Z853 Personal history of malignant neoplasm of breast: Secondary | ICD-10-CM | POA: Diagnosis not present

## 2019-09-26 DIAGNOSIS — R413 Other amnesia: Secondary | ICD-10-CM | POA: Diagnosis not present

## 2019-09-26 DIAGNOSIS — E785 Hyperlipidemia, unspecified: Secondary | ICD-10-CM | POA: Diagnosis not present

## 2019-09-26 DIAGNOSIS — Z9221 Personal history of antineoplastic chemotherapy: Secondary | ICD-10-CM | POA: Diagnosis not present

## 2019-09-26 DIAGNOSIS — I10 Essential (primary) hypertension: Secondary | ICD-10-CM | POA: Diagnosis not present

## 2019-09-27 DIAGNOSIS — R2681 Unsteadiness on feet: Secondary | ICD-10-CM | POA: Diagnosis not present

## 2019-09-27 DIAGNOSIS — R531 Weakness: Secondary | ICD-10-CM | POA: Diagnosis not present

## 2019-09-27 DIAGNOSIS — M5440 Lumbago with sciatica, unspecified side: Secondary | ICD-10-CM | POA: Diagnosis not present

## 2019-09-27 DIAGNOSIS — Z9889 Other specified postprocedural states: Secondary | ICD-10-CM | POA: Diagnosis not present

## 2019-09-27 DIAGNOSIS — G8929 Other chronic pain: Secondary | ICD-10-CM | POA: Diagnosis not present

## 2019-10-03 DIAGNOSIS — M5440 Lumbago with sciatica, unspecified side: Secondary | ICD-10-CM | POA: Diagnosis not present

## 2019-10-03 DIAGNOSIS — R531 Weakness: Secondary | ICD-10-CM | POA: Diagnosis not present

## 2019-10-03 DIAGNOSIS — R2681 Unsteadiness on feet: Secondary | ICD-10-CM | POA: Diagnosis not present

## 2019-10-03 DIAGNOSIS — Z9889 Other specified postprocedural states: Secondary | ICD-10-CM | POA: Diagnosis not present

## 2019-10-03 DIAGNOSIS — G8929 Other chronic pain: Secondary | ICD-10-CM | POA: Diagnosis not present

## 2019-10-04 DIAGNOSIS — R413 Other amnesia: Secondary | ICD-10-CM | POA: Diagnosis not present

## 2019-10-16 DIAGNOSIS — M818 Other osteoporosis without current pathological fracture: Secondary | ICD-10-CM | POA: Diagnosis not present

## 2019-10-16 DIAGNOSIS — K222 Esophageal obstruction: Secondary | ICD-10-CM | POA: Diagnosis not present

## 2019-10-16 DIAGNOSIS — Z171 Estrogen receptor negative status [ER-]: Secondary | ICD-10-CM | POA: Diagnosis not present

## 2019-10-16 DIAGNOSIS — Z9189 Other specified personal risk factors, not elsewhere classified: Secondary | ICD-10-CM | POA: Diagnosis not present

## 2019-10-16 DIAGNOSIS — K760 Fatty (change of) liver, not elsewhere classified: Secondary | ICD-10-CM | POA: Diagnosis not present

## 2019-10-16 DIAGNOSIS — G620 Drug-induced polyneuropathy: Secondary | ICD-10-CM | POA: Diagnosis not present

## 2019-10-16 DIAGNOSIS — T451X5A Adverse effect of antineoplastic and immunosuppressive drugs, initial encounter: Secondary | ICD-10-CM | POA: Diagnosis not present

## 2019-10-16 DIAGNOSIS — K219 Gastro-esophageal reflux disease without esophagitis: Secondary | ICD-10-CM | POA: Diagnosis not present

## 2019-10-16 DIAGNOSIS — C50412 Malignant neoplasm of upper-outer quadrant of left female breast: Secondary | ICD-10-CM | POA: Diagnosis not present

## 2019-10-16 DIAGNOSIS — E559 Vitamin D deficiency, unspecified: Secondary | ICD-10-CM | POA: Diagnosis not present

## 2019-10-16 DIAGNOSIS — I10 Essential (primary) hypertension: Secondary | ICD-10-CM | POA: Diagnosis not present

## 2019-10-16 DIAGNOSIS — Z79899 Other long term (current) drug therapy: Secondary | ICD-10-CM | POA: Diagnosis not present

## 2019-10-18 DIAGNOSIS — G8929 Other chronic pain: Secondary | ICD-10-CM | POA: Diagnosis not present

## 2019-10-18 DIAGNOSIS — G3184 Mild cognitive impairment, so stated: Secondary | ICD-10-CM | POA: Diagnosis not present

## 2019-10-18 DIAGNOSIS — R531 Weakness: Secondary | ICD-10-CM | POA: Diagnosis not present

## 2019-10-18 DIAGNOSIS — Z712 Person consulting for explanation of examination or test findings: Secondary | ICD-10-CM | POA: Diagnosis not present

## 2019-10-18 DIAGNOSIS — Z9889 Other specified postprocedural states: Secondary | ICD-10-CM | POA: Diagnosis not present

## 2019-10-18 DIAGNOSIS — M5440 Lumbago with sciatica, unspecified side: Secondary | ICD-10-CM | POA: Diagnosis not present

## 2019-10-18 DIAGNOSIS — R2681 Unsteadiness on feet: Secondary | ICD-10-CM | POA: Diagnosis not present

## 2019-10-23 DIAGNOSIS — M5440 Lumbago with sciatica, unspecified side: Secondary | ICD-10-CM | POA: Diagnosis not present

## 2019-10-23 DIAGNOSIS — R2681 Unsteadiness on feet: Secondary | ICD-10-CM | POA: Diagnosis not present

## 2019-10-23 DIAGNOSIS — G8929 Other chronic pain: Secondary | ICD-10-CM | POA: Diagnosis not present

## 2019-10-23 DIAGNOSIS — R531 Weakness: Secondary | ICD-10-CM | POA: Diagnosis not present

## 2019-10-23 DIAGNOSIS — Z9889 Other specified postprocedural states: Secondary | ICD-10-CM | POA: Diagnosis not present

## 2019-10-24 DIAGNOSIS — E039 Hypothyroidism, unspecified: Secondary | ICD-10-CM | POA: Diagnosis not present

## 2019-10-24 DIAGNOSIS — R945 Abnormal results of liver function studies: Secondary | ICD-10-CM | POA: Diagnosis not present

## 2019-10-24 DIAGNOSIS — R739 Hyperglycemia, unspecified: Secondary | ICD-10-CM | POA: Diagnosis not present

## 2019-10-24 DIAGNOSIS — E782 Mixed hyperlipidemia: Secondary | ICD-10-CM | POA: Diagnosis not present

## 2019-10-24 DIAGNOSIS — I10 Essential (primary) hypertension: Secondary | ICD-10-CM | POA: Diagnosis not present

## 2019-10-25 DIAGNOSIS — M5440 Lumbago with sciatica, unspecified side: Secondary | ICD-10-CM | POA: Diagnosis not present

## 2019-10-25 DIAGNOSIS — R2681 Unsteadiness on feet: Secondary | ICD-10-CM | POA: Diagnosis not present

## 2019-10-25 DIAGNOSIS — G8929 Other chronic pain: Secondary | ICD-10-CM | POA: Diagnosis not present

## 2019-10-25 DIAGNOSIS — R531 Weakness: Secondary | ICD-10-CM | POA: Diagnosis not present

## 2019-10-25 DIAGNOSIS — Z9889 Other specified postprocedural states: Secondary | ICD-10-CM | POA: Diagnosis not present

## 2019-10-28 DIAGNOSIS — I10 Essential (primary) hypertension: Secondary | ICD-10-CM | POA: Diagnosis not present

## 2019-10-28 DIAGNOSIS — K222 Esophageal obstruction: Secondary | ICD-10-CM | POA: Diagnosis not present

## 2019-10-28 DIAGNOSIS — K219 Gastro-esophageal reflux disease without esophagitis: Secondary | ICD-10-CM | POA: Diagnosis not present

## 2019-10-28 DIAGNOSIS — Z86010 Personal history of colonic polyps: Secondary | ICD-10-CM | POA: Diagnosis not present

## 2019-10-28 DIAGNOSIS — K5792 Diverticulitis of intestine, part unspecified, without perforation or abscess without bleeding: Secondary | ICD-10-CM | POA: Diagnosis not present

## 2019-11-01 DIAGNOSIS — R2681 Unsteadiness on feet: Secondary | ICD-10-CM | POA: Diagnosis not present

## 2019-11-01 DIAGNOSIS — G8929 Other chronic pain: Secondary | ICD-10-CM | POA: Diagnosis not present

## 2019-11-01 DIAGNOSIS — M5440 Lumbago with sciatica, unspecified side: Secondary | ICD-10-CM | POA: Diagnosis not present

## 2019-11-01 DIAGNOSIS — Z9889 Other specified postprocedural states: Secondary | ICD-10-CM | POA: Diagnosis not present

## 2019-11-01 DIAGNOSIS — R531 Weakness: Secondary | ICD-10-CM | POA: Diagnosis not present

## 2019-11-08 DIAGNOSIS — M5440 Lumbago with sciatica, unspecified side: Secondary | ICD-10-CM | POA: Diagnosis not present

## 2019-11-08 DIAGNOSIS — R2681 Unsteadiness on feet: Secondary | ICD-10-CM | POA: Diagnosis not present

## 2019-11-08 DIAGNOSIS — R531 Weakness: Secondary | ICD-10-CM | POA: Diagnosis not present

## 2019-11-08 DIAGNOSIS — G8929 Other chronic pain: Secondary | ICD-10-CM | POA: Diagnosis not present

## 2019-11-08 DIAGNOSIS — Z9889 Other specified postprocedural states: Secondary | ICD-10-CM | POA: Diagnosis not present

## 2019-11-22 DIAGNOSIS — R2681 Unsteadiness on feet: Secondary | ICD-10-CM | POA: Diagnosis not present

## 2019-11-22 DIAGNOSIS — R531 Weakness: Secondary | ICD-10-CM | POA: Diagnosis not present

## 2019-11-22 DIAGNOSIS — G8929 Other chronic pain: Secondary | ICD-10-CM | POA: Diagnosis not present

## 2019-11-22 DIAGNOSIS — M5440 Lumbago with sciatica, unspecified side: Secondary | ICD-10-CM | POA: Diagnosis not present

## 2019-11-22 DIAGNOSIS — Z9889 Other specified postprocedural states: Secondary | ICD-10-CM | POA: Diagnosis not present

## 2019-11-29 DIAGNOSIS — R531 Weakness: Secondary | ICD-10-CM | POA: Diagnosis not present

## 2019-11-29 DIAGNOSIS — Z9889 Other specified postprocedural states: Secondary | ICD-10-CM | POA: Diagnosis not present

## 2019-11-29 DIAGNOSIS — R2681 Unsteadiness on feet: Secondary | ICD-10-CM | POA: Diagnosis not present

## 2019-11-29 DIAGNOSIS — M5440 Lumbago with sciatica, unspecified side: Secondary | ICD-10-CM | POA: Diagnosis not present

## 2019-11-29 DIAGNOSIS — G8929 Other chronic pain: Secondary | ICD-10-CM | POA: Diagnosis not present

## 2019-12-03 DIAGNOSIS — Z9889 Other specified postprocedural states: Secondary | ICD-10-CM | POA: Diagnosis not present

## 2019-12-03 DIAGNOSIS — M5440 Lumbago with sciatica, unspecified side: Secondary | ICD-10-CM | POA: Diagnosis not present

## 2019-12-03 DIAGNOSIS — G8929 Other chronic pain: Secondary | ICD-10-CM | POA: Diagnosis not present

## 2019-12-03 DIAGNOSIS — R2681 Unsteadiness on feet: Secondary | ICD-10-CM | POA: Diagnosis not present

## 2019-12-03 DIAGNOSIS — R531 Weakness: Secondary | ICD-10-CM | POA: Diagnosis not present

## 2019-12-04 DIAGNOSIS — E785 Hyperlipidemia, unspecified: Secondary | ICD-10-CM | POA: Diagnosis not present

## 2019-12-04 DIAGNOSIS — E782 Mixed hyperlipidemia: Secondary | ICD-10-CM | POA: Diagnosis not present

## 2019-12-04 DIAGNOSIS — R7401 Elevation of levels of liver transaminase levels: Secondary | ICD-10-CM | POA: Diagnosis not present

## 2019-12-04 DIAGNOSIS — I10 Essential (primary) hypertension: Secondary | ICD-10-CM | POA: Diagnosis not present

## 2019-12-04 DIAGNOSIS — I480 Paroxysmal atrial fibrillation: Secondary | ICD-10-CM | POA: Diagnosis not present

## 2019-12-04 DIAGNOSIS — K760 Fatty (change of) liver, not elsewhere classified: Secondary | ICD-10-CM | POA: Diagnosis not present

## 2019-12-04 DIAGNOSIS — M1990 Unspecified osteoarthritis, unspecified site: Secondary | ICD-10-CM | POA: Diagnosis not present

## 2019-12-04 DIAGNOSIS — R079 Chest pain, unspecified: Secondary | ICD-10-CM | POA: Diagnosis not present

## 2019-12-04 DIAGNOSIS — Z853 Personal history of malignant neoplasm of breast: Secondary | ICD-10-CM | POA: Diagnosis not present

## 2019-12-04 DIAGNOSIS — I4891 Unspecified atrial fibrillation: Secondary | ICD-10-CM | POA: Diagnosis not present

## 2019-12-04 DIAGNOSIS — R7989 Other specified abnormal findings of blood chemistry: Secondary | ICD-10-CM | POA: Diagnosis not present

## 2019-12-04 DIAGNOSIS — K219 Gastro-esophageal reflux disease without esophagitis: Secondary | ICD-10-CM | POA: Diagnosis not present

## 2019-12-04 DIAGNOSIS — K8020 Calculus of gallbladder without cholecystitis without obstruction: Secondary | ICD-10-CM | POA: Diagnosis not present

## 2019-12-04 DIAGNOSIS — K7689 Other specified diseases of liver: Secondary | ICD-10-CM | POA: Diagnosis not present

## 2019-12-04 DIAGNOSIS — K828 Other specified diseases of gallbladder: Secondary | ICD-10-CM | POA: Diagnosis not present

## 2019-12-05 DIAGNOSIS — I4891 Unspecified atrial fibrillation: Secondary | ICD-10-CM | POA: Diagnosis not present

## 2019-12-05 DIAGNOSIS — E876 Hypokalemia: Secondary | ICD-10-CM | POA: Diagnosis not present

## 2019-12-05 DIAGNOSIS — I10 Essential (primary) hypertension: Secondary | ICD-10-CM | POA: Diagnosis not present

## 2019-12-05 DIAGNOSIS — R7989 Other specified abnormal findings of blood chemistry: Secondary | ICD-10-CM | POA: Diagnosis not present

## 2019-12-06 DIAGNOSIS — K8020 Calculus of gallbladder without cholecystitis without obstruction: Secondary | ICD-10-CM | POA: Diagnosis not present

## 2019-12-06 DIAGNOSIS — K219 Gastro-esophageal reflux disease without esophagitis: Secondary | ICD-10-CM | POA: Diagnosis not present

## 2019-12-06 DIAGNOSIS — I059 Rheumatic mitral valve disease, unspecified: Secondary | ICD-10-CM | POA: Diagnosis not present

## 2019-12-06 DIAGNOSIS — R7989 Other specified abnormal findings of blood chemistry: Secondary | ICD-10-CM | POA: Diagnosis not present

## 2019-12-06 DIAGNOSIS — I517 Cardiomegaly: Secondary | ICD-10-CM | POA: Diagnosis not present

## 2019-12-06 DIAGNOSIS — I10 Essential (primary) hypertension: Secondary | ICD-10-CM | POA: Diagnosis not present

## 2019-12-06 DIAGNOSIS — I4891 Unspecified atrial fibrillation: Secondary | ICD-10-CM | POA: Diagnosis not present

## 2019-12-18 DIAGNOSIS — Z7689 Persons encountering health services in other specified circumstances: Secondary | ICD-10-CM | POA: Diagnosis not present

## 2019-12-18 DIAGNOSIS — I10 Essential (primary) hypertension: Secondary | ICD-10-CM | POA: Diagnosis not present

## 2019-12-18 DIAGNOSIS — K8020 Calculus of gallbladder without cholecystitis without obstruction: Secondary | ICD-10-CM | POA: Diagnosis not present

## 2019-12-19 DIAGNOSIS — I10 Essential (primary) hypertension: Secondary | ICD-10-CM | POA: Diagnosis not present

## 2019-12-19 DIAGNOSIS — I4891 Unspecified atrial fibrillation: Secondary | ICD-10-CM | POA: Diagnosis not present

## 2019-12-19 DIAGNOSIS — K8020 Calculus of gallbladder without cholecystitis without obstruction: Secondary | ICD-10-CM | POA: Diagnosis not present

## 2019-12-19 DIAGNOSIS — R945 Abnormal results of liver function studies: Secondary | ICD-10-CM | POA: Diagnosis not present

## 2020-01-01 DIAGNOSIS — I4891 Unspecified atrial fibrillation: Secondary | ICD-10-CM | POA: Diagnosis not present

## 2020-01-01 DIAGNOSIS — E7800 Pure hypercholesterolemia, unspecified: Secondary | ICD-10-CM | POA: Diagnosis not present

## 2020-01-01 DIAGNOSIS — M810 Age-related osteoporosis without current pathological fracture: Secondary | ICD-10-CM | POA: Diagnosis not present

## 2020-01-01 DIAGNOSIS — M1990 Unspecified osteoarthritis, unspecified site: Secondary | ICD-10-CM | POA: Diagnosis not present

## 2020-01-01 DIAGNOSIS — R079 Chest pain, unspecified: Secondary | ICD-10-CM | POA: Diagnosis not present

## 2020-01-01 DIAGNOSIS — Z853 Personal history of malignant neoplasm of breast: Secondary | ICD-10-CM | POA: Diagnosis not present

## 2020-01-01 DIAGNOSIS — Z7902 Long term (current) use of antithrombotics/antiplatelets: Secondary | ICD-10-CM | POA: Diagnosis not present

## 2020-01-01 DIAGNOSIS — Z79899 Other long term (current) drug therapy: Secondary | ICD-10-CM | POA: Diagnosis not present

## 2020-01-01 DIAGNOSIS — I10 Essential (primary) hypertension: Secondary | ICD-10-CM | POA: Diagnosis not present

## 2020-01-16 DIAGNOSIS — I10 Essential (primary) hypertension: Secondary | ICD-10-CM | POA: Diagnosis not present

## 2020-01-16 DIAGNOSIS — I340 Nonrheumatic mitral (valve) insufficiency: Secondary | ICD-10-CM | POA: Diagnosis not present

## 2020-01-16 DIAGNOSIS — I480 Paroxysmal atrial fibrillation: Secondary | ICD-10-CM | POA: Diagnosis not present

## 2020-02-12 DIAGNOSIS — K8080 Other cholelithiasis without obstruction: Secondary | ICD-10-CM | POA: Diagnosis not present

## 2020-02-12 DIAGNOSIS — K760 Fatty (change of) liver, not elsewhere classified: Secondary | ICD-10-CM | POA: Diagnosis not present

## 2020-02-12 DIAGNOSIS — Z86010 Personal history of colonic polyps: Secondary | ICD-10-CM | POA: Diagnosis not present

## 2020-02-12 DIAGNOSIS — I10 Essential (primary) hypertension: Secondary | ICD-10-CM | POA: Diagnosis not present

## 2020-02-12 DIAGNOSIS — Z853 Personal history of malignant neoplasm of breast: Secondary | ICD-10-CM | POA: Diagnosis not present

## 2020-02-12 DIAGNOSIS — K222 Esophageal obstruction: Secondary | ICD-10-CM | POA: Diagnosis not present

## 2020-02-12 DIAGNOSIS — Z79899 Other long term (current) drug therapy: Secondary | ICD-10-CM | POA: Diagnosis not present

## 2020-02-12 DIAGNOSIS — I4891 Unspecified atrial fibrillation: Secondary | ICD-10-CM | POA: Diagnosis not present

## 2020-02-12 DIAGNOSIS — K219 Gastro-esophageal reflux disease without esophagitis: Secondary | ICD-10-CM | POA: Diagnosis not present

## 2020-02-12 DIAGNOSIS — Z791 Long term (current) use of non-steroidal anti-inflammatories (NSAID): Secondary | ICD-10-CM | POA: Diagnosis not present

## 2020-02-12 DIAGNOSIS — I480 Paroxysmal atrial fibrillation: Secondary | ICD-10-CM | POA: Diagnosis not present

## 2020-02-12 DIAGNOSIS — E782 Mixed hyperlipidemia: Secondary | ICD-10-CM | POA: Diagnosis not present

## 2020-02-12 DIAGNOSIS — G620 Drug-induced polyneuropathy: Secondary | ICD-10-CM | POA: Diagnosis not present

## 2020-02-12 DIAGNOSIS — K8020 Calculus of gallbladder without cholecystitis without obstruction: Secondary | ICD-10-CM | POA: Diagnosis not present

## 2020-03-17 DIAGNOSIS — L820 Inflamed seborrheic keratosis: Secondary | ICD-10-CM | POA: Diagnosis not present

## 2020-03-17 DIAGNOSIS — L821 Other seborrheic keratosis: Secondary | ICD-10-CM | POA: Diagnosis not present

## 2020-03-17 DIAGNOSIS — L570 Actinic keratosis: Secondary | ICD-10-CM | POA: Diagnosis not present

## 2020-03-18 DIAGNOSIS — M25561 Pain in right knee: Secondary | ICD-10-CM | POA: Diagnosis not present

## 2020-03-18 DIAGNOSIS — M8580 Other specified disorders of bone density and structure, unspecified site: Secondary | ICD-10-CM | POA: Diagnosis not present

## 2020-03-18 DIAGNOSIS — M1711 Unilateral primary osteoarthritis, right knee: Secondary | ICD-10-CM | POA: Diagnosis not present

## 2020-03-27 DIAGNOSIS — R413 Other amnesia: Secondary | ICD-10-CM | POA: Diagnosis not present

## 2020-04-23 DIAGNOSIS — E559 Vitamin D deficiency, unspecified: Secondary | ICD-10-CM | POA: Diagnosis not present

## 2020-04-23 DIAGNOSIS — C50412 Malignant neoplasm of upper-outer quadrant of left female breast: Secondary | ICD-10-CM | POA: Diagnosis not present

## 2020-04-23 DIAGNOSIS — I509 Heart failure, unspecified: Secondary | ICD-10-CM | POA: Diagnosis not present

## 2020-04-23 DIAGNOSIS — Z171 Estrogen receptor negative status [ER-]: Secondary | ICD-10-CM | POA: Diagnosis not present

## 2020-04-23 DIAGNOSIS — E876 Hypokalemia: Secondary | ICD-10-CM | POA: Diagnosis not present

## 2020-04-23 DIAGNOSIS — Z9189 Other specified personal risk factors, not elsewhere classified: Secondary | ICD-10-CM | POA: Diagnosis not present

## 2020-04-23 DIAGNOSIS — I480 Paroxysmal atrial fibrillation: Secondary | ICD-10-CM | POA: Diagnosis not present

## 2020-04-23 DIAGNOSIS — Z853 Personal history of malignant neoplasm of breast: Secondary | ICD-10-CM | POA: Diagnosis not present

## 2020-04-23 DIAGNOSIS — G620 Drug-induced polyneuropathy: Secondary | ICD-10-CM | POA: Diagnosis not present

## 2020-04-23 DIAGNOSIS — K219 Gastro-esophageal reflux disease without esophagitis: Secondary | ICD-10-CM | POA: Diagnosis not present

## 2020-04-23 DIAGNOSIS — E871 Hypo-osmolality and hyponatremia: Secondary | ICD-10-CM | POA: Diagnosis not present

## 2020-04-23 DIAGNOSIS — I890 Lymphedema, not elsewhere classified: Secondary | ICD-10-CM | POA: Diagnosis not present

## 2020-04-23 DIAGNOSIS — K760 Fatty (change of) liver, not elsewhere classified: Secondary | ICD-10-CM | POA: Diagnosis not present

## 2020-04-23 DIAGNOSIS — R7989 Other specified abnormal findings of blood chemistry: Secondary | ICD-10-CM | POA: Diagnosis not present

## 2020-04-23 DIAGNOSIS — K7689 Other specified diseases of liver: Secondary | ICD-10-CM | POA: Diagnosis not present

## 2020-04-23 DIAGNOSIS — T451X5A Adverse effect of antineoplastic and immunosuppressive drugs, initial encounter: Secondary | ICD-10-CM | POA: Diagnosis not present

## 2020-04-23 DIAGNOSIS — M818 Other osteoporosis without current pathological fracture: Secondary | ICD-10-CM | POA: Diagnosis not present

## 2020-05-04 DIAGNOSIS — Z0000 Encounter for general adult medical examination without abnormal findings: Secondary | ICD-10-CM | POA: Diagnosis not present

## 2020-05-04 DIAGNOSIS — E782 Mixed hyperlipidemia: Secondary | ICD-10-CM | POA: Diagnosis not present

## 2020-05-04 DIAGNOSIS — E039 Hypothyroidism, unspecified: Secondary | ICD-10-CM | POA: Diagnosis not present

## 2020-05-04 DIAGNOSIS — I10 Essential (primary) hypertension: Secondary | ICD-10-CM | POA: Diagnosis not present

## 2020-05-04 DIAGNOSIS — R739 Hyperglycemia, unspecified: Secondary | ICD-10-CM | POA: Diagnosis not present

## 2020-05-04 DIAGNOSIS — Z23 Encounter for immunization: Secondary | ICD-10-CM | POA: Diagnosis not present

## 2020-05-06 DIAGNOSIS — M8580 Other specified disorders of bone density and structure, unspecified site: Secondary | ICD-10-CM | POA: Diagnosis not present

## 2020-05-06 DIAGNOSIS — E538 Deficiency of other specified B group vitamins: Secondary | ICD-10-CM | POA: Diagnosis not present

## 2020-05-06 DIAGNOSIS — E039 Hypothyroidism, unspecified: Secondary | ICD-10-CM | POA: Diagnosis not present

## 2020-05-06 DIAGNOSIS — R739 Hyperglycemia, unspecified: Secondary | ICD-10-CM | POA: Diagnosis not present

## 2020-05-06 DIAGNOSIS — E782 Mixed hyperlipidemia: Secondary | ICD-10-CM | POA: Diagnosis not present

## 2020-05-12 DIAGNOSIS — Z79899 Other long term (current) drug therapy: Secondary | ICD-10-CM | POA: Diagnosis not present

## 2020-05-12 DIAGNOSIS — G4733 Obstructive sleep apnea (adult) (pediatric): Secondary | ICD-10-CM | POA: Diagnosis not present

## 2020-05-12 DIAGNOSIS — G3184 Mild cognitive impairment, so stated: Secondary | ICD-10-CM | POA: Diagnosis not present

## 2020-07-13 DIAGNOSIS — I10 Essential (primary) hypertension: Secondary | ICD-10-CM | POA: Diagnosis not present

## 2020-07-13 DIAGNOSIS — I480 Paroxysmal atrial fibrillation: Secondary | ICD-10-CM | POA: Diagnosis not present

## 2020-07-13 DIAGNOSIS — I340 Nonrheumatic mitral (valve) insufficiency: Secondary | ICD-10-CM | POA: Diagnosis not present

## 2020-07-16 DIAGNOSIS — M1711 Unilateral primary osteoarthritis, right knee: Secondary | ICD-10-CM | POA: Diagnosis not present
